# Patient Record
Sex: Male | Born: 1965
Health system: Southern US, Community
[De-identification: ages and names within clinical notes are randomized; demographics above are authoritative.]

## PROBLEM LIST (undated history)

## (undated) DIAGNOSIS — E785 Hyperlipidemia, unspecified: Secondary | ICD-10-CM

## (undated) DIAGNOSIS — I10 Essential (primary) hypertension: Secondary | ICD-10-CM

## (undated) DIAGNOSIS — N200 Calculus of kidney: Secondary | ICD-10-CM

## (undated) DIAGNOSIS — Z87442 Personal history of urinary calculi: Secondary | ICD-10-CM

## (undated) HISTORY — PX: NO PAST SURGERIES: SHX2092

## (undated) HISTORY — DX: Hyperlipidemia, unspecified: E78.5

## (undated) HISTORY — DX: Essential (primary) hypertension: I10

---

## 2015-11-27 ENCOUNTER — Other Ambulatory Visit: Payer: Self-pay | Admitting: *Deleted

## 2015-11-27 ENCOUNTER — Telehealth: Payer: Self-pay | Admitting: Physician Assistant

## 2015-11-27 NOTE — Telephone Encounter (Signed)
Patient advised to call [facility] and have them to fax [place] a request.

## 2015-12-04 MED ORDER — LOSARTAN POTASSIUM 100 MG PO TABS: 100.0000 mg | ORAL_TABLET | Freq: Every day | ORAL | 0 refills | Status: DC

## 2016-01-06 ENCOUNTER — Telehealth: Payer: Self-pay | Admitting: Physician Assistant

## 2016-01-06 ENCOUNTER — Other Ambulatory Visit: Payer: Self-pay | Admitting: Physician Assistant

## 2016-01-06 MED ORDER — LOSARTAN POTASSIUM 100 MG PO TABS: 100.0000 mg | ORAL_TABLET | Freq: Every day | ORAL | 0 refills | Status: DC

## 2016-01-06 NOTE — Telephone Encounter (Signed)
Patient aware rx sent to pharmacy.  

## 2016-01-13 ENCOUNTER — Encounter: Payer: Self-pay | Admitting: Physician Assistant

## 2016-01-13 ENCOUNTER — Ambulatory Visit (INDEPENDENT_AMBULATORY_CARE_PROVIDER_SITE_OTHER): Payer: BLUE CROSS/BLUE SHIELD | Admitting: Physician Assistant

## 2016-01-13 VITALS — BP 122/85 | HR 56 | Temp 97.2°F | Ht 68.0 in | Wt 213.8 lb

## 2016-01-13 DIAGNOSIS — Z0000 Encounter for general adult medical examination without abnormal findings: Secondary | ICD-10-CM | POA: Diagnosis not present

## 2016-01-13 MED ORDER — LOSARTAN POTASSIUM 100 MG PO TABS: 100.0000 mg | ORAL_TABLET | Freq: Every day | ORAL | 11 refills | Status: DC

## 2016-01-13 MED ORDER — AMLODIPINE BESYLATE 10 MG PO TABS: 10.0000 mg | ORAL_TABLET | Freq: Every day | ORAL | 11 refills | Status: DC

## 2016-01-13 NOTE — Progress Notes (Signed)
BP 122/85   Pulse (!) 56   Temp 97.2 F (36.2 C) (Oral)   Ht _0  (1.727 m)   Wt 213 lb 12.8 oz (97 kg)   BMI 32.51 kg/m    Subjective:    Patient ID: Matthew Weber, male    DOB: 28-Feb-1965, 50 y.o.   MRN: [id number]  Matthew Weber is a 50 y.o. male presenting on 01/13/2016 for Annual Exam  HPI Patient here to be established as new patient at [facility].  This patient is known to me from [facility]. This patient comes in for annual well physical examination. All medications are reviewed today. There are no reports of any problems with the medications. All of the medical conditions are reviewed and updated.  Lab work is reviewed and will be ordered as medically necessary. There are no new problems reported with today's visit.  Patient reports doing well overall.  Past Medical History:  Diagnosis Date  . Hypertension    Relevant past medical, surgical, family and social history reviewed and updated as indicated. Interim medical history since our last visit reviewed. Allergies and medications reviewed and updated.   Data reviewed from any sources in EPIC.  Review of Systems  Constitutional: Negative.  Negative for appetite change and fatigue.  HENT: Negative.   Eyes: Negative.  Negative for pain and visual disturbance.  Respiratory: Negative.  Negative for cough, chest tightness, shortness of breath and wheezing.   Cardiovascular: Negative.  Negative for chest pain, palpitations and leg swelling.  Gastrointestinal: Negative.  Negative for abdominal pain, diarrhea, nausea and vomiting.  Endocrine: Negative.   Genitourinary: Negative.   Musculoskeletal: Positive for arthralgias and joint swelling.  Skin: Negative.  Negative for color change and rash.  Neurological: Negative.  Negative for weakness, numbness and headaches.  Psychiatric/Behavioral: Negative.      Social History   Social History  . Marital status: Married    Spouse name: N/A    . Number of children: N/A  . Years of education: N/A   Occupational History  . Not on file.   Social History Main Topics  . Smoking status: Never Smoker  . Smokeless tobacco: Never Used  . Alcohol use No  . Drug use: No  . Sexual activity: Not on file   Other Topics Concern  . Not on file   Social History Narrative  . No narrative on file    History reviewed. No pertinent surgical history.  Family History  Problem Relation Age of Onset  . Diabetes Mother   . Heart attack Father     Allergies as of 01/13/2016   No Known Allergies     Medication List       Accurate as of 01/13/16 11:17 AM. Always use your most recent med list.          amLODipine 10 MG tablet Commonly known as:  NORVASC Take 1 tablet (10 mg total) by mouth daily.   aspirin 325 MG tablet Take 325 mg by mouth daily.   Fish Oil 1200 MG Caps Take 1,200 mg by mouth daily.   Garlic 6553 MG Caps Take 1,000 mg by mouth 2 (two) times daily.   losartan 100 MG tablet Commonly known as:  COZAAR Take 1 tablet (100 mg total) by mouth daily.   Magnesium 250 MG Tabs Take 250 mg by mouth 2 (two) times daily.          Objective:    BP 122/85  Pulse (!) 56   Temp 97.2 F (36.2 C) (Oral)   Ht _0  (1.727 m)   Wt 213 lb 12.8 oz (97 kg)   BMI 32.51 kg/m   No Known Allergies Wt Readings from Last 3 Encounters:  01/13/16 213 lb 12.8 oz (97 kg)    Physical Exam  Constitutional: He appears well-developed and well-nourished.  HENT:  Head: Normocephalic and atraumatic.  Eyes: Conjunctivae and EOM are normal. Pupils are equal, round, and reactive to light.  Neck: Normal range of motion. Neck supple.  Cardiovascular: Normal rate, regular rhythm and normal heart sounds.   Pulmonary/Chest: Effort normal and breath sounds normal.  Abdominal: Soft. Bowel sounds are normal.  Musculoskeletal:       Right knee: He exhibits decreased range of motion and swelling. Tenderness found.       Left knee:  He exhibits decreased range of motion and swelling.  Skin: Skin is warm and dry.    No results found for this or any previous visit.    Assessment & Plan:   1. Well adult exam - CBC with Differential/Platelet - CMP14+EGFR - Lipid panel - PSA   Continue all other maintenance medications as listed above. Educational handout given for health maintenance, DASH diet   Follow up plan: Return in about 6 months (around 07/13/2016) for recheck BP.  Terald Sleeper PA-C [facility] [address]  [place], [place] [address] [phone]   01/13/2016, 11:17 AM

## 2016-01-13 NOTE — Patient Instructions (Addendum)
DASH Eating Plan DASH stands for "Dietary Approaches to Stop Hypertension." The DASH eating plan is a healthy eating plan that has been shown to reduce high blood pressure (hypertension). Additional health benefits may include reducing the risk of type 2 diabetes mellitus, heart disease, and stroke. The DASH eating plan may also help with weight loss. What do I need to know about the DASH eating plan? For the DASH eating plan, you will follow these general guidelines:  Choose foods with less than 150 milligrams of sodium per serving (as listed on the food label).  Use salt-free seasonings or herbs instead of table salt or sea salt.  Check with your health care provider or pharmacist before using salt substitutes.  Eat lower-sodium products. These are often labeled as "low-sodium" or "no salt added."  Eat fresh foods. Avoid eating a lot of canned foods.  Eat more vegetables, fruits, and low-fat dairy products.  Choose whole grains. Look for the word "whole" as the first word in the ingredient list.  Choose fish and skinless chicken or turkey more often than red meat. Limit fish, poultry, and meat to 6 oz (170 g) each day.  Limit sweets, desserts, sugars, and sugary drinks.  Choose heart-healthy fats.  Eat more home-cooked food and less restaurant, buffet, and fast food.  Limit fried foods.  Do not fry foods. Cook foods using methods such as baking, boiling, grilling, and broiling instead.  When eating at a restaurant, ask that your food be prepared with less salt, or no salt if possible. What foods can I eat? Seek help from a dietitian for individual calorie needs. Grains  Whole grain or whole wheat bread. Brown rice. Whole grain or whole wheat pasta. Quinoa, bulgur, and whole grain cereals. Low-sodium cereals. Corn or whole wheat flour tortillas. Whole grain cornbread. Whole grain crackers. Low-sodium crackers. Vegetables  Fresh or frozen vegetables (raw, steamed, roasted, or  grilled). Low-sodium or reduced-sodium tomato and vegetable juices. Low-sodium or reduced-sodium tomato sauce and paste. Low-sodium or reduced-sodium canned vegetables. Fruits  All fresh, canned (in natural juice), or frozen fruits. Meat and Other Protein Products  Ground beef (85% or leaner), grass-fed beef, or beef trimmed of fat. Skinless chicken or turkey. Ground chicken or turkey. Pork trimmed of fat. All fish and seafood. Eggs. Dried beans, peas, or lentils. Unsalted nuts and seeds. Unsalted canned beans. Dairy  Low-fat dairy products, such as skim or 1% milk, 2% or reduced-fat cheeses, low-fat ricotta or cottage cheese, or plain low-fat yogurt. Low-sodium or reduced-sodium cheeses. Fats and Oils  Tub margarines without trans fats. Light or reduced-fat mayonnaise and salad dressings (reduced sodium). Avocado. Safflower, olive, or canola oils. Natural peanut or almond butter. Other  Unsalted popcorn and pretzels. The items listed above may not be a complete list of recommended foods or beverages. Contact your dietitian for more options.  What foods are not recommended? Grains  White bread. White pasta. White rice. Refined cornbread. Bagels and croissants. Crackers that contain trans fat. Vegetables  Creamed or fried vegetables. Vegetables in a cheese sauce. Regular canned vegetables. Regular canned tomato sauce and paste. Regular tomato and vegetable juices. Fruits  Canned fruit in light or heavy syrup. Fruit juice. Meat and Other Protein Products  Fatty cuts of meat. Ribs, chicken wings, bacon, sausage, bologna, salami, chitterlings, fatback, hot dogs, bratwurst, and packaged luncheon meats. Salted nuts and seeds. Canned beans with salt. Dairy  Whole or 2% milk, cream, half-and-half, and cream cheese. Whole-fat or sweetened yogurt. Full-fat cheeses   or blue cheese. Nondairy creamers and whipped toppings. Processed cheese, cheese spreads, or cheese curds. Condiments  Onion and garlic  salt, seasoned salt, table salt, and sea salt. Canned and packaged gravies. Worcestershire sauce. Tartar sauce. Barbecue sauce. Teriyaki sauce. Soy sauce, including reduced sodium. Steak sauce. Fish sauce. Oyster sauce. Cocktail sauce. Horseradish. Ketchup and mustard. Meat flavorings and tenderizers. Bouillon cubes. Hot sauce. Tabasco sauce. Marinades. Taco seasonings. Relishes. Fats and Oils  Butter, stick margarine, lard, shortening, ghee, and bacon fat. Coconut, palm kernel, or palm oils. Regular salad dressings. Other  Pickles and olives. Salted popcorn and pretzels. The items listed above may not be a complete list of foods and beverages to avoid. Contact your dietitian for more information.  Where can I find more information? National Heart, Lung, and Blood Institute: travelstabloid.com This information is not intended to replace advice given to you by your health care provider. Make sure you discuss any questions you have with your health care provider. Document Released: 12/30/2010 Document Revised: 06/18/2015 Document Reviewed: 11/14/2012 Elsevier Interactive Patient Education  2017 [place] Maintenance, Male A healthy lifestyle and preventative care can promote health and wellness.  Maintain regular health, dental, and eye exams.  Eat a healthy diet. Foods like vegetables, fruits, whole grains, low-fat dairy products, and lean protein foods contain the nutrients you need and are low in calories. Decrease your intake of foods high in solid fats, added sugars, and salt. Get information about a proper diet from your health care provider, if necessary.  Regular physical exercise is one of the most important things you can do for your health. Most adults should get at least 150 minutes of moderate-intensity exercise (any activity that increases your heart rate and causes you to sweat) each week. In addition, most adults need muscle-strengthening  exercises on 2 or more days a week.   Maintain a healthy weight. The body mass index (BMI) is a screening tool to identify possible weight problems. It provides an estimate of body fat based on height and weight. Your health care provider can find your BMI and can help you achieve or maintain a healthy weight. For males 20 years and older:  A BMI below 18.5 is considered underweight.  A BMI of 18.5 to 24.9 is normal.  A BMI of 25 to 29.9 is considered overweight.  A BMI of 30 and above is considered obese.  Maintain normal blood lipids and cholesterol by exercising and minimizing your intake of saturated fat. Eat a balanced diet with plenty of fruits and vegetables. Blood tests for lipids and cholesterol should begin at age 38 and be repeated every 5 years. If your lipid or cholesterol levels are high, you are over age 19, or you are at high risk for heart disease, you may need your cholesterol levels checked more frequently.Ongoing high lipid and cholesterol levels should be treated with medicines if diet and exercise are not working.  If you smoke, find out from your health care provider how to quit. If you do not use tobacco, do not start.  Lung cancer screening is recommended for adults aged 40-80 years who are at high risk for developing lung cancer because of a history of smoking. A yearly low-dose CT scan of the lungs is recommended for people who have at least a 30-pack-year history of smoking and are current smokers or have quit within the past 15 years. A pack year of smoking is smoking an average of 1 pack of cigarettes a day  for 1 year (for example, a 30-pack-year history of smoking could mean smoking 1 pack a day for 30 years or 2 packs a day for 15 years). Yearly screening should continue until the smoker has stopped smoking for at least 15 years. Yearly screening should be stopped for people who develop a health problem that would prevent them from having lung cancer treatment.  If  you choose to drink alcohol, do not have more than 2 drinks per day. One drink is considered to be 12 oz (360 mL) of beer, 5 oz (150 mL) of wine, or 1.5 oz (45 mL) of liquor.  Avoid the use of street drugs. Do not share needles with anyone. Ask for help if you need support or instructions about stopping the use of drugs.  High blood pressure causes heart disease and increases the risk of stroke. High blood pressure is more likely to develop in:  People who have blood pressure in the end of the normal range (100-139/85-89 mm Hg).  People who are overweight or obese.  People who are African American.  If you are 46-23 years of age, have your blood pressure checked every 3-5 years. If you are 58 years of age or older, have your blood pressure checked every year. You should have your blood pressure measured twice-once when you are at a hospital or clinic, and once when you are not at a hospital or clinic. Record the average of the two measurements. To check your blood pressure when you are not at a hospital or clinic, you can use:  An automated blood pressure machine at a pharmacy.  A home blood pressure monitor.  If you are 68-57 years old, ask your health care provider if you should take aspirin to prevent heart disease.  Diabetes screening involves taking a blood sample to check your fasting blood sugar level. This should be done once every 3 years after age 64 if you are at a normal weight and without risk factors for diabetes. Testing should be considered at a younger age or be carried out more frequently if you are overweight and have at least 1 risk factor for diabetes.  Colorectal cancer can be detected and often prevented. Most routine colorectal cancer screening begins at the age of 74 and continues through age 19. However, your health care provider may recommend screening at an earlier age if you have risk factors for colon cancer. On a yearly basis, your health care provider may provide  home test kits to check for hidden blood in the stool. A small camera at the end of a tube may be used to directly examine the colon (sigmoidoscopy or colonoscopy) to detect the earliest forms of colorectal cancer. Talk to your health care provider about this at age 66 when routine screening begins. A direct exam of the colon should be repeated every 5-10 years through age 53, unless early forms of precancerous polyps or small growths are found.  People who are at an increased risk for hepatitis B should be screened for this virus. You are considered at high risk for hepatitis B if:  You were born in a country where hepatitis B occurs often. Talk with your health care provider about which countries are considered high risk.  Your parents were born in a high-risk country and you have not received a shot to protect against hepatitis B (hepatitis B vaccine).  You have HIV or AIDS.  You use needles to inject street drugs.  You live with,  or have sex with, someone who has hepatitis B.  You are a man who has sex with other men (MSM).  You get hemodialysis treatment.  You take certain medicines for conditions like cancer, organ transplantation, and autoimmune conditions.  Hepatitis C blood testing is recommended for all people born from [birth year] through 1966 and any individual with known risk factors for hepatitis C.  Healthy men should no longer receive prostate-specific antigen (PSA) blood tests as part of routine cancer screening. Talk to your health care provider about prostate cancer screening.  Testicular cancer screening is not recommended for adolescents or adult males who have no symptoms. Screening includes self-exam, a health care provider exam, and other screening tests. Consult with your health care provider about any symptoms you have or any concerns you have about testicular cancer.  Practice safe sex. Use condoms and avoid high-risk sexual practices to reduce the spread of sexually  transmitted infections (STIs).  You should be screened for STIs, including gonorrhea and chlamydia if:  You are sexually active and are younger than 24 years.  You are older than 24 years, and your health care provider tells you that you are at risk for this type of infection.  Your sexual activity has changed since you were last screened, and you are at an increased risk for chlamydia or gonorrhea. Ask your health care provider if you are at risk.  If you are at risk of being infected with HIV, it is recommended that you take a prescription medicine daily to prevent HIV infection. This is called pre-exposure prophylaxis (PrEP). You are considered at risk if:  You are a man who has sex with other men (MSM).  You are a heterosexual man who is sexually active with multiple partners.  You take drugs by injection.  You are sexually active with a partner who has HIV.  Talk with your health care provider about whether you are at high risk of being infected with HIV. If you choose to begin PrEP, you should first be tested for HIV. You should then be tested every 3 months for as long as you are taking PrEP.  Use sunscreen. Apply sunscreen liberally and repeatedly throughout the day. You should seek shade when your shadow is shorter than you. Protect yourself by wearing long sleeves, pants, a wide-brimmed hat, and sunglasses year round whenever you are outdoors.  Tell your health care provider of new moles or changes in moles, especially if there is a change in shape or color. Also, tell your health care provider if a mole is larger than the size of a pencil eraser.  A one-time screening for abdominal aortic aneurysm (AAA) and surgical repair of large AAAs by ultrasound is recommended for men aged 33-75 years who are current or former smokers.  Stay current with your vaccines (immunizations). This information is not intended to replace advice given to you by your health care provider. Make sure  you discuss any questions you have with your health care provider. Document Released: 07/09/2007 Document Revised: 01/31/2014 Document Reviewed: 10/14/2014 Elsevier Interactive Patient Education  2017 [employer].

## 2016-01-14 LAB — CMP14+EGFR
A/G RATIO: 1.8 (ref 1.2–2.2)
ALK PHOS: 69 IU/L (ref 39–117)
ALT: 25 IU/L (ref 0–44)
AST: 23 IU/L (ref 0–40)
Albumin: 4.5 g/dL (ref 3.5–5.5)
BUN/Creatinine Ratio: 14 (ref 9–20)
BUN: 13 mg/dL (ref 6–24)
Bilirubin Total: 1.7 mg/dL — ABNORMAL HIGH (ref 0.0–1.2)
CO2: 24 mmol/L (ref 18–29)
Calcium: 9.6 mg/dL (ref 8.7–10.2)
Chloride: 102 mmol/L (ref 96–106)
Creatinine, Ser: 0.92 mg/dL (ref 0.76–1.27)
GFR calc Af Amer: 111 mL/min/{1.73_m2} (ref >59)
GFR calc non Af Amer: 96 mL/min/{1.73_m2} (ref >59)
GLOBULIN, TOTAL: 2.5 g/dL (ref 1.5–4.5)
Glucose: 79 mg/dL (ref 65–99)
POTASSIUM: 4 mmol/L (ref 3.5–5.2)
SODIUM: 141 mmol/L (ref 134–144)
Total Protein: 7 g/dL (ref 6.0–8.5)

## 2016-01-14 LAB — CBC WITH DIFFERENTIAL/PLATELET
BASOS ABS: 0 10*3/uL (ref 0.0–0.2)
BASOS: 1 %
EOS (ABSOLUTE): 0.1 10*3/uL (ref 0.0–0.4)
Eos: 3 %
Hematocrit: 39.4 % (ref 37.5–51.0)
Hemoglobin: 13.6 g/dL (ref 13.0–17.7)
IMMATURE GRANS (ABS): 0 10*3/uL (ref 0.0–0.1)
IMMATURE GRANULOCYTES: 0 %
LYMPHS: 23 %
Lymphocytes Absolute: 1 10*3/uL (ref 0.7–3.1)
MCH: 30.1 pg (ref 26.6–33.0)
MCHC: 34.5 g/dL (ref 31.5–35.7)
MCV: 87 fL (ref 79–97)
MONOS ABS: 0.4 10*3/uL (ref 0.1–0.9)
Monocytes: 10 %
NEUTROS PCT: 63 %
Neutrophils Absolute: 2.6 10*3/uL (ref 1.4–7.0)
PLATELETS: 280 10*3/uL (ref 150–379)
RBC: 4.52 x10E6/uL (ref 4.14–5.80)
RDW: 13.8 % (ref 12.3–15.4)
WBC: 4.2 10*3/uL (ref 3.4–10.8)

## 2016-01-14 LAB — LIPID PANEL
CHOLESTEROL TOTAL: 208 mg/dL — AB (ref 100–199)
Chol/HDL Ratio: 4.4 ratio units (ref 0.0–5.0)
HDL: 47 mg/dL (ref >39)
LDL Calculated: 133 mg/dL — ABNORMAL HIGH (ref 0–99)
Triglycerides: 138 mg/dL (ref 0–149)
VLDL Cholesterol Cal: 28 mg/dL (ref 5–40)

## 2016-01-14 LAB — PSA: Prostate Specific Ag, Serum: 0.7 ng/mL (ref 0.0–4.0)

## 2016-05-18 ENCOUNTER — Encounter: Payer: Self-pay | Admitting: Family Medicine

## 2016-05-18 ENCOUNTER — Ambulatory Visit (INDEPENDENT_AMBULATORY_CARE_PROVIDER_SITE_OTHER): Payer: BLUE CROSS/BLUE SHIELD | Admitting: Family Medicine

## 2016-05-18 VITALS — BP 114/80 | HR 69 | Temp 97.1°F | Ht 68.0 in | Wt 213.0 lb

## 2016-05-18 DIAGNOSIS — J069 Acute upper respiratory infection, unspecified: Secondary | ICD-10-CM

## 2016-05-18 DIAGNOSIS — M7989 Other specified soft tissue disorders: Secondary | ICD-10-CM | POA: Diagnosis not present

## 2016-05-18 MED ORDER — FLUTICASONE PROPIONATE 50 MCG/ACT NA SUSP: 1.0000 | Freq: Two times a day (BID) | NASAL | 6 refills | Status: DC | PRN

## 2016-05-18 NOTE — Progress Notes (Signed)
BP 114/80   Pulse 69   Temp 97.1 F (36.2 C) (Oral)   Ht 5\' 8"  (1.727 m)   Wt 213 lb (96.6 kg)   BMI 32.39 kg/m    Subjective:    Patient ID: Matthew Weber, male    DOB: 1965-03-05, 51 y.o.   MRN: [id number]  HPI: Ludger Bones is a 51 y.o. male presenting on 05/18/2016 for Knot on throat (first noticed 3 weeks ago, has not changed) and Sinusitis (sinus pressure, cough)   HPI Sinus congestion and cough Patient has been having sinus congestion and cough that has been going on for the past 1 week. The cough is nonproductive and the sinus pressure associated with drainage and postnasal drainage. He says that there have been others in his family that have been ill with similar illness and his wife was ill last week with similar thing. He denies any fevers or chills or shortness of breath or wheezing. He has not used anything over-the-counter to this point to help with that just yet. He does not feel like it's improving or worsening but just sticking around.  A knot on his throat Patient has a knot on the front of his throat that he feels like his come up over the past 3 weeks. It is soft and moves easily and it's not painful. The knot is right in the midline of his throat just below his Adam's apple. He has never had this previously and denies any trauma. He does have a current upper respiratory infection that is likely viral in nature. He denies any difficulties with swallowing or breathing because of this knot.  Relevant past medical, surgical, family and social history reviewed and updated as indicated. Interim medical history since our last visit reviewed. Allergies and medications reviewed and updated.  Review of Systems  Constitutional: Negative for chills and fever.  HENT: Positive for congestion, postnasal drip, rhinorrhea, sinus pressure, sneezing and sore throat. Negative for ear discharge, ear pain and voice change.   Eyes: Negative for pain, discharge, redness and visual  disturbance.  Respiratory: Negative for shortness of breath and wheezing.   Cardiovascular: Negative for chest pain and leg swelling.  Musculoskeletal: Negative for back pain and gait problem.  Skin: Negative for color change, rash and wound.  Neurological: Negative for dizziness, weakness, light-headedness and numbness.  All other systems reviewed and are negative.  Per HPI unless specifically indicated above     Objective:    BP 114/80   Pulse 69   Temp 97.1 F (36.2 C) (Oral)   Ht 5\' 8"  (1.727 m)   Wt 213 lb (96.6 kg)   BMI 32.39 kg/m   Wt Readings from Last 3 Encounters:  05/18/16 213 lb (96.6 kg)  01/13/16 213 lb 12.8 oz (97 kg)    Physical Exam  Constitutional: He is oriented to person, place, and time. He appears well-developed and well-nourished. No distress.  HENT:  Right Ear: Tympanic membrane, external ear and ear canal normal.  Left Ear: Tympanic membrane, external ear and ear canal normal.  Nose: Mucosal edema and rhinorrhea present. No sinus tenderness. No epistaxis. Right sinus exhibits maxillary sinus tenderness. Right sinus exhibits no frontal sinus tenderness. Left sinus exhibits maxillary sinus tenderness. Left sinus exhibits no frontal sinus tenderness.  Mouth/Throat: Uvula is midline and mucous membranes are normal. Posterior oropharyngeal edema and posterior oropharyngeal erythema present. No oropharyngeal exudate or tonsillar abscesses.  Eyes: Conjunctivae are normal. No scleral icterus.  Neck: Neck supple. No  thyromegaly present.    Cardiovascular: Normal rate, regular rhythm, normal heart sounds and intact distal pulses.   No murmur heard. Pulmonary/Chest: Effort normal and breath sounds normal. No respiratory distress. He has no wheezes. He has no rales.  Musculoskeletal: Normal range of motion. He exhibits no edema.  Lymphadenopathy:    He has no cervical adenopathy.  Neurological: He is alert and oriented to person, place, and time. Coordination  normal.  Skin: Skin is warm and dry. No rash noted. He is not diaphoretic.  Psychiatric: He has a normal mood and affect. His behavior is normal.  Nursing note and vitals reviewed.       Assessment & Plan:   Problem List Items Addressed This Visit    None    Visit Diagnoses    Nodule of soft tissue    -  Primary   Nodule of the soft tissue on the anterior neck, will send for ultrasound   Relevant Orders   US Soft Tissue Head/Neck   Viral upper respiratory infection       Relevant Medications   fluticasone (FLONASE) 50 MCG/ACT nasal spray       Follow up plan: Return if symptoms worsen or fail to improve.  Counseling provided for all of the vaccine components Orders Placed This Encounter  Procedures  . US Soft Tissue Head/Neck    Joshua Dettinger, MD [facility] 05/18/2016, 2:58 PM

## 2016-05-27 ENCOUNTER — Ambulatory Visit (HOSPITAL_COMMUNITY)
Admission: RE | Admit: 2016-05-27 | Discharge: 2016-05-27 | Disposition: A | Discharge status: Home / Self Care | Payer: BLUE CROSS/BLUE SHIELD | Source: Ambulatory Visit | Attending: Family Medicine | Admitting: Family Medicine

## 2016-05-27 ENCOUNTER — Ambulatory Visit (HOSPITAL_COMMUNITY): Payer: Self-pay

## 2016-05-27 ENCOUNTER — Ambulatory Visit (HOSPITAL_COMMUNITY): Payer: BLUE CROSS/BLUE SHIELD

## 2016-05-27 DIAGNOSIS — M7989 Other specified soft tissue disorders: Secondary | ICD-10-CM | POA: Diagnosis present

## 2016-05-30 ENCOUNTER — Other Ambulatory Visit: Payer: Self-pay | Admitting: *Deleted

## 2016-05-30 DIAGNOSIS — M7989 Other specified soft tissue disorders: Secondary | ICD-10-CM

## 2016-06-21 ENCOUNTER — Telehealth: Payer: Self-pay | Admitting: Family Medicine

## 2016-06-21 NOTE — Telephone Encounter (Signed)
Call given to nurse °

## 2016-07-13 ENCOUNTER — Ambulatory Visit: Payer: BLUE CROSS/BLUE SHIELD | Admitting: Physician Assistant

## 2016-08-09 DIAGNOSIS — D170 Benign lipomatous neoplasm of skin and subcutaneous tissue of head, face and neck: Secondary | ICD-10-CM | POA: Insufficient documentation

## 2016-08-17 ENCOUNTER — Encounter: Payer: Self-pay | Admitting: Physician Assistant

## 2016-08-17 ENCOUNTER — Ambulatory Visit (INDEPENDENT_AMBULATORY_CARE_PROVIDER_SITE_OTHER): Payer: BLUE CROSS/BLUE SHIELD | Admitting: Physician Assistant

## 2016-08-17 VITALS — BP 127/85 | HR 69 | Temp 96.9°F | Ht 68.0 in | Wt 205.2 lb

## 2016-08-17 DIAGNOSIS — E7889 Other lipoprotein metabolism disorders: Secondary | ICD-10-CM

## 2016-08-17 DIAGNOSIS — J301 Allergic rhinitis due to pollen: Secondary | ICD-10-CM | POA: Diagnosis not present

## 2016-08-17 DIAGNOSIS — I10 Essential (primary) hypertension: Secondary | ICD-10-CM | POA: Diagnosis not present

## 2016-08-17 LAB — LIPID PANEL
CHOLESTEROL TOTAL: 201 mg/dL — AB (ref 100–199)
Chol/HDL Ratio: 4.7 ratio (ref 0.0–5.0)
HDL: 43 mg/dL (ref >39)
LDL CALC: 132 mg/dL — AB (ref 0–99)
TRIGLYCERIDES: 131 mg/dL (ref 0–149)
VLDL CHOLESTEROL CAL: 26 mg/dL (ref 5–40)

## 2016-08-17 MED ORDER — LORATADINE 10 MG PO TABS: 10.0000 mg | ORAL_TABLET | Freq: Every day | ORAL | 3 refills | Status: DC

## 2016-08-17 NOTE — Progress Notes (Signed)
BP 127/85   Pulse 69   Temp (!) 96.9 F (36.1 C) (Oral)   Ht 5' 8"  (1.727 m)   Wt 205 lb 3.2 oz (93.1 kg)   BMI 31.20 kg/m    Subjective:    Patient ID: Matthew Weber, male    DOB: [birth year], 51 y.o.   MRN: [id number]  HPI: Matthew Weber is a 51 y.o. male presenting on 08/17/2016 for Follow-up (6 month ) and Nausea  This patient comes in for periodic recheck on medications and conditions including hypertension, elevated cholesterol, allergic rhinitis. He has been doing very well with his diet and exercise and is down about 8 more pounds. Blood pressures been quite good when he checks it. We will draw the lipid today to see that it is improved. He is continuing with some nasal congestion and fullness and postnasal drainage. He was unable to take the [facility] because he had a nosebleed after using it.   All medications are reviewed today. There are no reports of any problems with the medications. All of the medical conditions are reviewed and updated.  Lab work is reviewed and will be ordered as medically necessary. There are no new problems reported with today's visit.   Relevant past medical, surgical, family and social history reviewed and updated as indicated. Allergies and medications reviewed and updated.  Past Medical History:  Diagnosis Date  . Hypertension     History reviewed. No pertinent surgical history.  Review of Systems  Constitutional: Negative.  Negative for appetite change and fatigue.  HENT: Positive for congestion, postnasal drip and rhinorrhea.   Eyes: Negative.  Negative for pain and visual disturbance.  Respiratory: Negative.  Negative for cough, chest tightness, shortness of breath and wheezing.   Cardiovascular: Negative.  Negative for chest pain, palpitations and leg swelling.  Gastrointestinal: Positive for nausea. Negative for abdominal pain, diarrhea and vomiting.  Endocrine: Negative.   Genitourinary: Negative.   Musculoskeletal: Negative.   Skin:  Negative.  Negative for color change and rash.  Neurological: Negative.  Negative for weakness, numbness and headaches.  Psychiatric/Behavioral: Negative.     Allergies as of 08/17/2016   No Known Allergies     Medication List       Accurate as of 08/17/16  9:42 AM. Always use your most recent med list.          amLODipine 10 MG tablet Commonly known as:  NORVASC Take 1 tablet (10 mg total) by mouth daily.   aspirin 325 MG tablet Take 325 mg by mouth daily.   Fish Oil 1200 MG Caps Take 1,200 mg by mouth daily.   Garlic 8882 MG Caps Take 1,000 mg by mouth 2 (two) times daily.   loratadine 10 MG tablet Commonly known as:  CLARITIN Take 1 tablet (10 mg total) by mouth daily.   losartan 100 MG tablet Commonly known as:  COZAAR Take 1 tablet (100 mg total) by mouth daily.   Magnesium 250 MG Tabs Take 250 mg by mouth 2 (two) times daily.          Objective:    BP 127/85   Pulse 69   Temp (!) 96.9 F (36.1 C) (Oral)   Ht 5' 8"  (1.727 m)   Wt 205 lb 3.2 oz (93.1 kg)   BMI 31.20 kg/m   No Known Allergies  Physical Exam  Constitutional: He appears well-developed and well-nourished. No distress.  HENT:  Head: Normocephalic and atraumatic.  Eyes: Pupils are equal, round,  and reactive to light. Conjunctivae and EOM are normal.  Cardiovascular: Normal rate, regular rhythm and normal heart sounds.   Pulmonary/Chest: Effort normal and breath sounds normal. No respiratory distress.  Skin: Skin is warm and dry.  Psychiatric: He has a normal mood and affect. His behavior is normal.  Nursing note and vitals reviewed.   Results for orders placed or performed in visit on 01/13/16  CBC with Differential/Platelet  Result Value Ref Range   WBC 4.2 3.4 - 10.8 x10E3/uL   RBC 4.52 4.14 - 5.80 x10E6/uL   Hemoglobin 13.6 13.0 - 17.7 g/dL   Hematocrit 39.4 37.5 - 51.0 %   MCV 87 79 - 97 fL   MCH 30.1 26.6 - 33.0 pg   MCHC 34.5 31.5 - 35.7 g/dL   RDW 13.8 12.3 - 15.4 %    Platelets 280 150 - 379 x10E3/uL   Neutrophils 63 Not Estab. %   Lymphs 23 Not Estab. %   Monocytes 10 Not Estab. %   Eos 3 Not Estab. %   Basos 1 Not Estab. %   Neutrophils Absolute 2.6 1.4 - 7.0 x10E3/uL   Lymphocytes Absolute 1.0 0.7 - 3.1 x10E3/uL   Monocytes Absolute 0.4 0.1 - 0.9 x10E3/uL   EOS (ABSOLUTE) 0.1 0.0 - 0.4 x10E3/uL   Basophils Absolute 0.0 0.0 - 0.2 x10E3/uL   Immature Granulocytes 0 Not Estab. %   Immature Grans (Abs) 0.0 0.0 - 0.1 x10E3/uL  CMP14+EGFR  Result Value Ref Range   Glucose 79 65 - 99 mg/dL   BUN 13 6 - 24 mg/dL   Creatinine, Ser 0.92 0.76 - 1.27 mg/dL   GFR calc non Af Amer 96 >59 mL/min/1.73   GFR calc Af Amer 111 >59 mL/min/1.73   BUN/Creatinine Ratio 14 9 - 20   Sodium 141 134 - 144 mmol/L   Potassium 4.0 3.5 - 5.2 mmol/L   Chloride 102 96 - 106 mmol/L   CO2 24 18 - 29 mmol/L   Calcium 9.6 8.7 - 10.2 mg/dL   Total Protein 7.0 6.0 - 8.5 g/dL   Albumin 4.5 3.5 - 5.5 g/dL   Globulin, Total 2.5 1.5 - 4.5 g/dL   Albumin/Globulin Ratio 1.8 1.2 - 2.2   Bilirubin Total 1.7 (H) 0.0 - 1.2 mg/dL   Alkaline Phosphatase 69 39 - 117 IU/L   AST 23 0 - 40 IU/L   ALT 25 0 - 44 IU/L  Lipid panel  Result Value Ref Range   Cholesterol, Total 208 (H) 100 - 199 mg/dL   Triglycerides 138 0 - 149 mg/dL   HDL 47 >39 mg/dL   VLDL Cholesterol Cal 28 5 - 40 mg/dL   LDL Calculated 133 (H) 0 - 99 mg/dL   Chol/HDL Ratio 4.4 0.0 - 5.0 ratio units  PSA  Result Value Ref Range   Prostate Specific Ag, Serum 0.7 0.0 - 4.0 ng/mL      Assessment & Plan:   1. Lipids abnormal - Lipid panel  2. Essential hypertension  3. Allergic rhinitis due to pollen, unspecified seasonality - loratadine (CLARITIN) 10 MG tablet; Take 1 tablet (10 mg total) by mouth daily.  Dispense: 90 tablet; Refill: 3   Current Outpatient Prescriptions:  .  amLODipine (NORVASC) 10 MG tablet, Take 1 tablet (10 mg total) by mouth daily., Disp: 30 tablet, Rfl: 11 .  aspirin 325 MG tablet, Take  325 mg by mouth daily., Disp: , Rfl:  .  Garlic 7416 MG CAPS, Take 1,000 mg  by mouth 2 (two) times daily., Disp: , Rfl:  .  losartan (COZAAR) 100 MG tablet, Take 1 tablet (100 mg total) by mouth daily., Disp: 30 tablet, Rfl: 11 .  Magnesium 250 MG TABS, Take 250 mg by mouth 2 (two) times daily., Disp: , Rfl:  .  Omega-3 Fatty Acids (FISH OIL) 1200 MG CAPS, Take 1,200 mg by mouth daily., Disp: , Rfl:  .  loratadine (CLARITIN) 10 MG tablet, Take 1 tablet (10 mg total) by mouth daily., Disp: 90 tablet, Rfl: 3  Continue all other maintenance medications as listed above.  Follow up plan: Return in about 6 months (around 02/17/2017) for recheck.  Educational handout given for [place] PA-C [facility] [address]  [place], [place] [address] [phone]   08/17/2016, 9:42 AM

## 2016-08-17 NOTE — Patient Instructions (Signed)
In a few days you may receive a survey in the mail or online from [employer] regarding your visit with us today. Please take a moment to fill this out. Your feedback is very important to our whole office. It can help us better understand your needs as well as improve your experience and satisfaction. Thank you for taking your time to complete it. We care about you.  Fleming Prill, PA-C  

## 2016-08-22 ENCOUNTER — Telehealth: Payer: Self-pay | Admitting: Physician Assistant

## 2016-08-22 NOTE — Telephone Encounter (Signed)
Patient aware of results and verbalizes understanding.  

## 2017-01-18 ENCOUNTER — Other Ambulatory Visit: Payer: Self-pay | Admitting: Physician Assistant

## 2017-01-18 NOTE — Telephone Encounter (Signed)
Last seen 08/17/16  [facility]

## 2017-02-06 ENCOUNTER — Other Ambulatory Visit: Payer: Self-pay | Admitting: Physician Assistant

## 2017-02-06 NOTE — Telephone Encounter (Signed)
OV 02/09/17

## 2017-02-20 ENCOUNTER — Encounter: Payer: Self-pay | Admitting: Physician Assistant

## 2017-02-20 ENCOUNTER — Ambulatory Visit (INDEPENDENT_AMBULATORY_CARE_PROVIDER_SITE_OTHER): Payer: BLUE CROSS/BLUE SHIELD | Admitting: Physician Assistant

## 2017-02-20 VITALS — BP 133/86 | HR 54 | Temp 96.7°F | Ht 68.0 in | Wt 215.6 lb

## 2017-02-20 DIAGNOSIS — Z0000 Encounter for general adult medical examination without abnormal findings: Secondary | ICD-10-CM

## 2017-02-20 NOTE — Progress Notes (Signed)
BP 133/86   Pulse (!) 54   Temp (!) 96.7 F (35.9 C) (Oral)   Ht _0  (1.727 m)   Wt 215 lb 9.6 oz (97.8 kg)   BMI 32.78 kg/m    Subjective:    Patient ID: Matthew Weber, male    DOB: 01/30/1965, 52 y.o.   MRN: [id number]  HPI: Matthew Weber is a 52 y.o. male presenting on 02/20/2017 for Annual Exam and Follow-up (6 month )  This patient comes in for annual well physical examination. All medications are reviewed today. There are no reports of any problems with the medications. All of the medical conditions are reviewed and updated.  Lab work is reviewed and will be ordered as medically necessary. There are no new problems reported with today's visit.  Patient reports doing well overall.   Relevant past medical, surgical, family and social history reviewed and updated as indicated. Allergies and medications reviewed and updated.  Past Medical History:  Diagnosis Date  . Hypertension     History reviewed. No pertinent surgical history.  Review of Systems  Allergies as of 02/20/2017   No Known Allergies     Medication List        Accurate as of 02/20/17  8:29 AM. Always use your most recent med list.          amLODipine 10 MG tablet Commonly known as:  NORVASC TAKE ONE TABLET BY MOUTH ONCE DAILY   aspirin 325 MG tablet Take 325 mg by mouth daily.   CORICIDIN HBP COUGH/COLD PO Take by mouth.   Fish Oil 1200 MG Caps Take 1,200 mg by mouth daily.   Garlic 5400 MG Caps Take 1,000 mg by mouth 2 (two) times daily.   loratadine 10 MG tablet Commonly known as:  CLARITIN Take 1 tablet (10 mg total) by mouth daily.   losartan 100 MG tablet Commonly known as:  COZAAR TAKE ONE TABLET BY MOUTH ONCE DAILY   Magnesium 250 MG Tabs Take 250 mg by mouth 2 (two) times daily.          Objective:    BP 133/86   Pulse (!) 54   Temp (!) 96.7 F (35.9 C) (Oral)   Ht _1  (1.727 m)   Wt 215 lb 9.6 oz (97.8 kg)   BMI 32.78 kg/m   No Known Allergies  Physical  Exam  Results for orders placed or performed in visit on 08/17/16  Lipid panel  Result Value Ref Range   Cholesterol, Total 201 (H) 100 - 199 mg/dL   Triglycerides 131 0 - 149 mg/dL   HDL 43 >39 mg/dL   VLDL Cholesterol Cal 26 5 - 40 mg/dL   LDL Calculated 132 (H) 0 - 99 mg/dL   Chol/HDL Ratio 4.7 0.0 - 5.0 ratio      Assessment & Plan:   1. Well adult exam - CBC with Differential/Platelet - CMP14+EGFR - Lipid panel - PSA    Current Outpatient Medications:  .  amLODipine (NORVASC) 10 MG tablet, TAKE ONE TABLET BY MOUTH ONCE DAILY, Disp: 90 tablet, Rfl: 0 .  aspirin 325 MG tablet, Take 325 mg by mouth daily., Disp: , Rfl:  .  Chlorpheniramine-DM (CORICIDIN HBP COUGH/COLD PO), Take by mouth., Disp: , Rfl:  .  Garlic 8676 MG CAPS, Take 1,000 mg by mouth 2 (two) times daily., Disp: , Rfl:  .  loratadine (CLARITIN) 10 MG tablet, Take 1 tablet (10 mg total) by mouth daily., Disp: 90  tablet, Rfl: 3 .  losartan (COZAAR) 100 MG tablet, TAKE ONE TABLET BY MOUTH ONCE DAILY, Disp: 30 tablet, Rfl: 0 .  Magnesium 250 MG TABS, Take 250 mg by mouth 2 (two) times daily., Disp: , Rfl:  .  Omega-3 Fatty Acids (FISH OIL) 1200 MG CAPS, Take 1,200 mg by mouth daily., Disp: , Rfl:  Continue all other maintenance medications as listed above.  Follow up plan: 6 months and 1 year  Educational handout given for well adult  Terald Sleeper PA-C [facility] [address]  [place], [place] [address] [phone]   02/20/2017, 8:29 AM

## 2017-02-20 NOTE — Patient Instructions (Signed)
Chlor-trimeton old antihistamine **Sudafed on the shelf, decongestant     Health Maintenance, Male A healthy lifestyle and preventive care is important for your health and wellness. Ask your health care provider about what schedule of regular examinations is right for you. What should I know about weight and diet? Eat a Healthy Diet  Eat plenty of vegetables, fruits, whole grains, low-fat dairy products, and lean protein.  Do not eat a lot of foods high in solid fats, added sugars, or salt.  Maintain a Healthy Weight Regular exercise can help you achieve or maintain a healthy weight. You should:  Do at least 150 minutes of exercise each week. The exercise should increase your heart rate and make you sweat (moderate-intensity exercise).  Do strength-training exercises at least twice a week.  Watch Your Levels of Cholesterol and Blood Lipids  Have your blood tested for lipids and cholesterol every 5 years starting at 53 years of age. If you are at high risk for heart disease, you should start having your blood tested when you are 53 years old. You may need to have your cholesterol levels checked more often if: ? Your lipid or cholesterol levels are high. ? You are older than 53 years of age. ? You are at high risk for heart disease.  What should I know about cancer screening? Many types of cancers can be detected early and may often be prevented. Lung Cancer  You should be screened every year for lung cancer if: ? You are a current smoker who has smoked for at least 30 years. ? You are a former smoker who has quit within the past 15 years.  Talk to your health care provider about your screening options, when you should start screening, and how often you should be screened.  Colorectal Cancer  Routine colorectal cancer screening usually begins at 53 years of age and should be repeated every 5-10 years until you are 53 years old. You may need to be screened more often if early  forms of precancerous polyps or small growths are found. Your health care provider may recommend screening at an earlier age if you have risk factors for colon cancer.  Your health care provider may recommend using home test kits to check for hidden blood in the stool.  A small camera at the end of a tube can be used to examine your colon (sigmoidoscopy or colonoscopy). This checks for the earliest forms of colorectal cancer.  Prostate and Testicular Cancer  Depending on your age and overall health, your health care provider may do certain tests to screen for prostate and testicular cancer.  Talk to your health care provider about any symptoms or concerns you have about testicular or prostate cancer.  Skin Cancer  Check your skin from head to toe regularly.  Tell your health care provider about any new moles or changes in moles, especially if: ? There is a change in a mole's size, shape, or color. ? You have a mole that is larger than a pencil eraser.  Always use sunscreen. Apply sunscreen liberally and repeat throughout the day.  Protect yourself by wearing long sleeves, pants, a wide-brimmed hat, and sunglasses when outside.  What should I know about heart disease, diabetes, and high blood pressure?  If you are 62-22 years of age, have your blood pressure checked every 3-5 years. If you are 54 years of age or older, have your blood pressure checked every year. You should have your blood pressure measured  twice-once when you are at a hospital or clinic, and once when you are not at a hospital or clinic. Record the average of the two measurements. To check your blood pressure when you are not at a hospital or clinic, you can use: ? An automated blood pressure machine at a pharmacy. ? A home blood pressure monitor.  Talk to your health care provider about your target blood pressure.  If you are between 10-46 years old, ask your health care provider if you should take aspirin to prevent  heart disease.  Have regular diabetes screenings by checking your fasting blood sugar level. ? If you are at a normal weight and have a low risk for diabetes, have this test once every three years after the age of 50. ? If you are overweight and have a high risk for diabetes, consider being tested at a younger age or more often.  A one-time screening for abdominal aortic aneurysm (AAA) by ultrasound is recommended for men aged 36-75 years who are current or former smokers. What should I know about preventing infection? Hepatitis B If you have a higher risk for hepatitis B, you should be screened for this virus. Talk with your health care provider to find out if you are at risk for hepatitis B infection. Hepatitis C Blood testing is recommended for:  Everyone born from [birth year] through 1966.  Anyone with known risk factors for hepatitis C.  Sexually Transmitted Diseases (STDs)  You should be screened each year for STDs including gonorrhea and chlamydia if: ? You are sexually active and are younger than 53 years of age. ? You are older than 53 years of age and your health care provider tells you that you are at risk for this type of infection. ? Your sexual activity has changed since you were last screened and you are at an increased risk for chlamydia or gonorrhea. Ask your health care provider if you are at risk.  Talk with your health care provider about whether you are at high risk of being infected with HIV. Your health care provider may recommend a prescription medicine to help prevent HIV infection.  What else can I do?  Schedule regular health, dental, and eye exams.  Stay current with your vaccines (immunizations).  Do not use any tobacco products, such as cigarettes, chewing tobacco, and e-cigarettes. If you need help quitting, ask your health care provider.  Limit alcohol intake to no more than 2 drinks per day. One drink equals 12 ounces of beer, 5 ounces of wine, or 1 ounces  of hard liquor.  Do not use street drugs.  Do not share needles.  Ask your health care provider for help if you need support or information about quitting drugs.  Tell your health care provider if you often feel depressed.  Tell your health care provider if you have ever been abused or do not feel safe at home. This information is not intended to replace advice given to you by your health care provider. Make sure you discuss any questions you have with your health care provider. Document Released: 07/09/2007 Document Revised: 09/09/2015 Document Reviewed: 10/14/2014 Elsevier Interactive Patient Education  [employer].

## 2017-02-21 LAB — LIPID PANEL
CHOL/HDL RATIO: 4.1 ratio (ref 0.0–5.0)
Cholesterol, Total: 211 mg/dL — ABNORMAL HIGH (ref 100–199)
HDL: 51 mg/dL (ref >39)
LDL Calculated: 134 mg/dL — ABNORMAL HIGH (ref 0–99)
TRIGLYCERIDES: 128 mg/dL (ref 0–149)
VLDL Cholesterol Cal: 26 mg/dL (ref 5–40)

## 2017-02-21 LAB — CMP14+EGFR
A/G RATIO: 1.6 (ref 1.2–2.2)
ALK PHOS: 64 IU/L (ref 39–117)
ALT: 25 IU/L (ref 0–44)
AST: 21 IU/L (ref 0–40)
Albumin: 4.6 g/dL (ref 3.5–5.5)
BUN / CREAT RATIO: 14 (ref 9–20)
BUN: 14 mg/dL (ref 6–24)
Bilirubin Total: 1.4 mg/dL — ABNORMAL HIGH (ref 0.0–1.2)
CALCIUM: 9.5 mg/dL (ref 8.7–10.2)
CO2: 25 mmol/L (ref 20–29)
Chloride: 103 mmol/L (ref 96–106)
Creatinine, Ser: 1.03 mg/dL (ref 0.76–1.27)
GFR calc Af Amer: 95 mL/min/{1.73_m2} (ref >59)
GFR, EST NON AFRICAN AMERICAN: 83 mL/min/{1.73_m2} (ref >59)
GLOBULIN, TOTAL: 2.8 g/dL (ref 1.5–4.5)
Glucose: 79 mg/dL (ref 65–99)
POTASSIUM: 4.6 mmol/L (ref 3.5–5.2)
SODIUM: 144 mmol/L (ref 134–144)
Total Protein: 7.4 g/dL (ref 6.0–8.5)

## 2017-02-21 LAB — CBC WITH DIFFERENTIAL/PLATELET
BASOS ABS: 0 10*3/uL (ref 0.0–0.2)
Basos: 1 %
EOS (ABSOLUTE): 0.1 10*3/uL (ref 0.0–0.4)
Eos: 4 %
HEMOGLOBIN: 15 g/dL (ref 13.0–17.7)
Hematocrit: 42.5 % (ref 37.5–51.0)
IMMATURE GRANS (ABS): 0 10*3/uL (ref 0.0–0.1)
IMMATURE GRANULOCYTES: 0 %
LYMPHS: 32 %
Lymphocytes Absolute: 1 10*3/uL (ref 0.7–3.1)
MCH: 31 pg (ref 26.6–33.0)
MCHC: 35.3 g/dL (ref 31.5–35.7)
MCV: 88 fL (ref 79–97)
MONOCYTES: 14 %
Monocytes Absolute: 0.4 10*3/uL (ref 0.1–0.9)
NEUTROS ABS: 1.5 10*3/uL (ref 1.4–7.0)
NEUTROS PCT: 49 %
PLATELETS: 254 10*3/uL (ref 150–379)
RBC: 4.84 x10E6/uL (ref 4.14–5.80)
RDW: 13.7 % (ref 12.3–15.4)
WBC: 3 10*3/uL — AB (ref 3.4–10.8)

## 2017-02-21 LAB — PSA: PROSTATE SPECIFIC AG, SERUM: 0.7 ng/mL (ref 0.0–4.0)

## 2017-03-01 ENCOUNTER — Telehealth: Payer: Self-pay | Admitting: *Deleted

## 2017-03-01 NOTE — Telephone Encounter (Signed)
Aware of lab results and knows a letter was sent with the results also.

## 2017-03-06 ENCOUNTER — Other Ambulatory Visit: Payer: Self-pay | Admitting: Physician Assistant

## 2017-07-11 ENCOUNTER — Other Ambulatory Visit: Payer: Self-pay | Admitting: Physician Assistant

## 2018-03-03 ENCOUNTER — Other Ambulatory Visit: Payer: Self-pay | Admitting: *Deleted

## 2018-03-03 ENCOUNTER — Other Ambulatory Visit: Payer: Managed Care, Other (non HMO)

## 2018-03-03 DIAGNOSIS — I10 Essential (primary) hypertension: Secondary | ICD-10-CM

## 2018-03-03 DIAGNOSIS — E7889 Other lipoprotein metabolism disorders: Secondary | ICD-10-CM

## 2018-03-03 DIAGNOSIS — Z0000 Encounter for general adult medical examination without abnormal findings: Secondary | ICD-10-CM

## 2018-03-04 LAB — CMP14+EGFR
ALT: 28 IU/L (ref 0–44)
AST: 27 IU/L (ref 0–40)
Albumin/Globulin Ratio: 1.6 (ref 1.2–2.2)
Albumin: 4.4 g/dL (ref 3.8–4.9)
Alkaline Phosphatase: 68 IU/L (ref 39–117)
BUN/Creatinine Ratio: 12 (ref 9–20)
BUN: 13 mg/dL (ref 6–24)
Bilirubin Total: 1.4 mg/dL — ABNORMAL HIGH (ref 0.0–1.2)
CO2: 24 mmol/L (ref 20–29)
Calcium: 9.6 mg/dL (ref 8.7–10.2)
Chloride: 105 mmol/L (ref 96–106)
Creatinine, Ser: 1.09 mg/dL (ref 0.76–1.27)
GFR calc Af Amer: 88 mL/min/{1.73_m2} (ref >59)
GFR calc non Af Amer: 77 mL/min/{1.73_m2} (ref >59)
Globulin, Total: 2.7 g/dL (ref 1.5–4.5)
Glucose: 79 mg/dL (ref 65–99)
Potassium: 4.4 mmol/L (ref 3.5–5.2)
Sodium: 144 mmol/L (ref 134–144)
TOTAL PROTEIN: 7.1 g/dL (ref 6.0–8.5)

## 2018-03-04 LAB — CBC WITH DIFFERENTIAL/PLATELET
Basophils Absolute: 0 x10E3/uL (ref 0.0–0.2)
Basos: 1 %
EOS (ABSOLUTE): 0.1 x10E3/uL (ref 0.0–0.4)
Eos: 4 %
Hematocrit: 45.5 % (ref 37.5–51.0)
Hemoglobin: 15.5 g/dL (ref 13.0–17.7)
Immature Grans (Abs): 0 x10E3/uL (ref 0.0–0.1)
Immature Granulocytes: 0 %
Lymphocytes Absolute: 0.8 x10E3/uL (ref 0.7–3.1)
Lymphs: 26 %
MCH: 30.3 pg (ref 26.6–33.0)
MCHC: 34.1 g/dL (ref 31.5–35.7)
MCV: 89 fL (ref 79–97)
Monocytes Absolute: 0.4 x10E3/uL (ref 0.1–0.9)
Monocytes: 13 %
Neutrophils Absolute: 1.8 x10E3/uL (ref 1.4–7.0)
Neutrophils: 56 %
Platelets: 269 x10E3/uL (ref 150–450)
RBC: 5.12 x10E6/uL (ref 4.14–5.80)
RDW: 13.8 % (ref 11.6–15.4)
WBC: 3.2 x10E3/uL — ABNORMAL LOW (ref 3.4–10.8)

## 2018-03-04 LAB — PSA, TOTAL AND FREE
PSA FREE: 0.23 ng/mL
PSA, Free Pct: 32.9 %
Prostate Specific Ag, Serum: 0.7 ng/mL (ref 0.0–4.0)

## 2018-03-04 LAB — LIPID PANEL
Chol/HDL Ratio: 5.2 ratio — ABNORMAL HIGH (ref 0.0–5.0)
Cholesterol, Total: 236 mg/dL — ABNORMAL HIGH (ref 100–199)
HDL: 45 mg/dL
LDL Calculated: 160 mg/dL — ABNORMAL HIGH (ref 0–99)
Triglycerides: 154 mg/dL — ABNORMAL HIGH (ref 0–149)
VLDL Cholesterol Cal: 31 mg/dL (ref 5–40)

## 2018-03-07 ENCOUNTER — Encounter: Payer: Self-pay | Admitting: Physician Assistant

## 2018-03-07 ENCOUNTER — Ambulatory Visit (INDEPENDENT_AMBULATORY_CARE_PROVIDER_SITE_OTHER): Payer: Managed Care, Other (non HMO) | Admitting: Physician Assistant

## 2018-03-07 VITALS — BP 151/89 | HR 61 | Temp 97.8°F | Ht 68.0 in | Wt 227.6 lb

## 2018-03-07 DIAGNOSIS — I10 Essential (primary) hypertension: Secondary | ICD-10-CM | POA: Diagnosis not present

## 2018-03-07 DIAGNOSIS — Z0001 Encounter for general adult medical examination with abnormal findings: Secondary | ICD-10-CM | POA: Diagnosis not present

## 2018-03-07 DIAGNOSIS — E785 Hyperlipidemia, unspecified: Secondary | ICD-10-CM

## 2018-03-07 DIAGNOSIS — M549 Dorsalgia, unspecified: Secondary | ICD-10-CM

## 2018-03-07 DIAGNOSIS — Z0000 Encounter for general adult medical examination without abnormal findings: Secondary | ICD-10-CM

## 2018-03-07 MED ORDER — LISINOPRIL 20 MG PO TABS: 20.0000 mg | ORAL_TABLET | Freq: Every day | ORAL | 3 refills | Status: DC

## 2018-03-07 MED ORDER — ATORVASTATIN CALCIUM 10 MG PO TABS: 10.0000 mg | ORAL_TABLET | Freq: Every day | ORAL | 3 refills | Status: DC

## 2018-03-07 NOTE — Patient Instructions (Signed)

## 2018-03-08 DIAGNOSIS — E785 Hyperlipidemia, unspecified: Secondary | ICD-10-CM | POA: Insufficient documentation

## 2018-03-08 NOTE — Progress Notes (Signed)
BP (!) 151/89   Pulse 61   Temp 97.8 F (36.6 C) (Oral)   Ht 5' 8"  (1.727 m)   Wt 227 lb 9.6 oz (103.2 kg)   BMI 34.61 kg/m    Subjective:    Patient ID: Matthew Weber, male    DOB: 10-14-1965, 53 y.o.   MRN: [id number]  HPI: Matthew Weber is a 53 y.o. male presenting on 03/07/2018 for Annual Exam  This patient comes in for annual well physical examination. All medications are reviewed today. There are no reports of any problems with the medications. All of the medical conditions are reviewed and updated.  Lab work is reviewed and will be ordered as medically necessary. There are no new problems reported with today's visit.  Patient reports doing well overall.   Past Medical History:  Diagnosis Date  . Hypertension    Relevant past medical, surgical, family and social history reviewed and updated as indicated. Interim medical history since our last visit reviewed. Allergies and medications reviewed and updated. DATA REVIEWED: CHART IN EPIC  Family History reviewed for pertinent findings.  Review of Systems  Constitutional: Negative.  Negative for appetite change and fatigue.  HENT: Negative.   Eyes: Negative.  Negative for pain and visual disturbance.  Respiratory: Negative.  Negative for cough, chest tightness, shortness of breath and wheezing.   Cardiovascular: Negative.  Negative for chest pain, palpitations and leg swelling.  Gastrointestinal: Negative.  Negative for abdominal pain, diarrhea, nausea and vomiting.  Endocrine: Negative.   Genitourinary: Negative.   Musculoskeletal: Positive for back pain and joint swelling.  Skin: Negative.  Negative for color change and rash.  Neurological: Negative.  Negative for weakness, numbness and headaches.  Psychiatric/Behavioral: Negative.     Allergies as of 03/07/2018   No Known Allergies     Medication List       Accurate as of March 07, 2018 11:59 PM. Always use your most recent med list.        amLODipine 10 MG  tablet Commonly known as:  NORVASC TAKE 1 TABLET BY MOUTH ONCE DAILY   aspirin 325 MG tablet Take 325 mg by mouth daily.   atorvastatin 10 MG tablet Commonly known as:  LIPITOR Take 1 tablet (10 mg total) by mouth daily.   CORICIDIN HBP COUGH/COLD PO Take by mouth.   Fish Oil 1200 MG Caps Take 1,200 mg by mouth daily.   Garlic 7680 MG Caps Take 1,000 mg by mouth 2 (two) times daily.   lisinopril 20 MG tablet Commonly known as:  PRINIVIL,ZESTRIL Take 1 tablet (20 mg total) by mouth daily.   loratadine 10 MG tablet Commonly known as:  CLARITIN Take 1 tablet (10 mg total) by mouth daily.   losartan 100 MG tablet Commonly known as:  COZAAR TAKE 1 TABLET BY MOUTH ONCE DAILY   Magnesium 250 MG Tabs Take 250 mg by mouth 2 (two) times daily.          Objective:    BP (!) 151/89   Pulse 61   Temp 97.8 F (36.6 C) (Oral)   Ht 5' 8"  (1.727 m)   Wt 227 lb 9.6 oz (103.2 kg)   BMI 34.61 kg/m   No Known Allergies  Wt Readings from Last 3 Encounters:  03/07/18 227 lb 9.6 oz (103.2 kg)  02/20/17 215 lb 9.6 oz (97.8 kg)  08/17/16 205 lb 3.2 oz (93.1 kg)    Physical Exam Constitutional:      Appearance: He  is well-developed.  HENT:     Head: Normocephalic and atraumatic.  Eyes:     Conjunctiva/sclera: Conjunctivae normal.     Pupils: Pupils are equal, round, and reactive to light.  Neck:     Musculoskeletal: Normal range of motion and neck supple.  Cardiovascular:     Rate and Rhythm: Normal rate and regular rhythm.     Heart sounds: Normal heart sounds.  Pulmonary:     Effort: Pulmonary effort is normal.     Breath sounds: Normal breath sounds.  Abdominal:     General: Bowel sounds are normal.     Palpations: Abdomen is soft.  Musculoskeletal: Normal range of motion.  Skin:    General: Skin is warm and dry.     Results for orders placed or performed in visit on 03/03/18  PSA, total and free  Result Value Ref Range   Prostate Specific Ag, Serum 0.7 0.0  - 4.0 ng/mL   PSA, Free 0.23 N/A ng/mL   PSA, Free Pct 32.9 %  Lipid panel  Result Value Ref Range   Cholesterol, Total 236 (H) 100 - 199 mg/dL   Triglycerides 154 (H) 0 - 149 mg/dL   HDL 45 >39 mg/dL   VLDL Cholesterol Cal 31 5 - 40 mg/dL   LDL Calculated 160 (H) 0 - 99 mg/dL   Chol/HDL Ratio 5.2 (H) 0.0 - 5.0 ratio  CMP14+EGFR  Result Value Ref Range   Glucose 79 65 - 99 mg/dL   BUN 13 6 - 24 mg/dL   Creatinine, Ser 1.09 0.76 - 1.27 mg/dL   GFR calc non Af Amer 77 >59 mL/min/1.73   GFR calc Af Amer 88 >59 mL/min/1.73   BUN/Creatinine Ratio 12 9 - 20   Sodium 144 134 - 144 mmol/L   Potassium 4.4 3.5 - 5.2 mmol/L   Chloride 105 96 - 106 mmol/L   CO2 24 20 - 29 mmol/L   Calcium 9.6 8.7 - 10.2 mg/dL   Total Protein 7.1 6.0 - 8.5 g/dL   Albumin 4.4 3.8 - 4.9 g/dL   Globulin, Total 2.7 1.5 - 4.5 g/dL   Albumin/Globulin Ratio 1.6 1.2 - 2.2   Bilirubin Total 1.4 (H) 0.0 - 1.2 mg/dL   Alkaline Phosphatase 68 39 - 117 IU/L   AST 27 0 - 40 IU/L   ALT 28 0 - 44 IU/L  CBC with Differential/Platelet  Result Value Ref Range   WBC 3.2 (L) 3.4 - 10.8 x10E3/uL   RBC 5.12 4.14 - 5.80 x10E6/uL   Hemoglobin 15.5 13.0 - 17.7 g/dL   Hematocrit 45.5 37.5 - 51.0 %   MCV 89 79 - 97 fL   MCH 30.3 26.6 - 33.0 pg   MCHC 34.1 31.5 - 35.7 g/dL   RDW 13.8 11.6 - 15.4 %   Platelets 269 150 - 450 x10E3/uL   Neutrophils 56 Not Estab. %   Lymphs 26 Not Estab. %   Monocytes 13 Not Estab. %   Eos 4 Not Estab. %   Basos 1 Not Estab. %   Neutrophils Absolute 1.8 1.4 - 7.0 x10E3/uL   Lymphocytes Absolute 0.8 0.7 - 3.1 x10E3/uL   Monocytes Absolute 0.4 0.1 - 0.9 x10E3/uL   EOS (ABSOLUTE) 0.1 0.0 - 0.4 x10E3/uL   Basophils Absolute 0.0 0.0 - 0.2 x10E3/uL   Immature Granulocytes 0 Not Estab. %   Immature Grans (Abs) 0.0 0.0 - 0.1 x10E3/uL      Assessment & Plan:   1. Well  adult exam Recheck one year Labs discussed  2. Essential hypertension - lisinopril (PRINIVIL,ZESTRIL) 20 MG tablet; Take 1  tablet (20 mg total) by mouth daily.  Dispense: 90 tablet; Refill: 3  3. Hyperlipidemia, unspecified hyperlipidemia type - atorvastatin (LIPITOR) 10 MG tablet; Take 1 tablet (10 mg total) by mouth daily.  Dispense: 90 tablet; Refill: 3  4. Acute midline back pain, unspecified back location Back exercises   Continue all other maintenance medications as listed above.  Follow up plan: Return in about 3 months (around 06/05/2018) for labs.  Educational handout given for back [place] PA-C [facility] [address]  [place], [place] [address] [phone]   03/08/2018, 7:28 PM

## 2018-06-04 ENCOUNTER — Other Ambulatory Visit: Payer: Self-pay

## 2018-06-05 ENCOUNTER — Encounter: Payer: Self-pay | Admitting: Physician Assistant

## 2018-06-05 ENCOUNTER — Ambulatory Visit (INDEPENDENT_AMBULATORY_CARE_PROVIDER_SITE_OTHER): Payer: Managed Care, Other (non HMO) | Admitting: Physician Assistant

## 2018-06-05 VITALS — BP 164/94 | HR 63 | Temp 97.8°F | Ht 68.0 in | Wt 221.0 lb

## 2018-06-05 DIAGNOSIS — I10 Essential (primary) hypertension: Secondary | ICD-10-CM | POA: Diagnosis not present

## 2018-06-05 DIAGNOSIS — E785 Hyperlipidemia, unspecified: Secondary | ICD-10-CM | POA: Diagnosis not present

## 2018-06-05 MED ORDER — AMLODIPINE BESYLATE 10 MG PO TABS: 10.0000 mg | ORAL_TABLET | Freq: Every day | ORAL | 1 refills | Status: DC

## 2018-06-05 NOTE — Progress Notes (Signed)
BP (!) 164/94   Pulse 63   Temp 97.8 F (36.6 C) (Oral)   Ht _0  (1.727 m)   Wt 221 lb (100.2 kg)   BMI 33.60 kg/m    Subjective:    Patient ID: Matthew Weber, male    DOB: 02-13-1965, 53 y.o.   MRN: [id number]  HPI: Matthew Weber is a 53 y.o. male presenting on 06/05/2018 for Hypertension  Comes in for recheck on his high blood pressure.  He had to be changed from losartan.  They could not get it in the store.  He went to lisinopril 20 mg and has not had complete control with this medication.  We are going to have him restart the amlodipine, this was when he had taken before.  He was very tolerant of it.  He states that overall he is doing fairly well.  He is working, he is not had to go out on short time.  He states that he is a little bit stress and not eating and exercising as well as he had in the past.  And he wants to get back doing this.  Past Medical History:  Diagnosis Date  . Hypertension    Relevant past medical, surgical, family and social history reviewed and updated as indicated. Interim medical history since our last visit reviewed. Allergies and medications reviewed and updated. DATA REVIEWED: CHART IN EPIC  Family History reviewed for pertinent findings.  Review of Systems  Constitutional: Negative.  Negative for appetite change and fatigue.  Eyes: Negative for pain and visual disturbance.  Respiratory: Negative.  Negative for cough, chest tightness, shortness of breath and wheezing.   Cardiovascular: Negative.  Negative for chest pain, palpitations and leg swelling.  Gastrointestinal: Negative.  Negative for abdominal pain, diarrhea, nausea and vomiting.  Genitourinary: Negative.   Musculoskeletal: Positive for back pain.  Skin: Negative.  Negative for color change and rash.  Neurological: Negative.  Negative for weakness, numbness and headaches.  Psychiatric/Behavioral: Negative.     Allergies as of 06/05/2018   No Known Allergies     Medication List      Accurate as of Jun 05, 2018  4:32 PM. If you have any questions, ask your nurse or doctor.        STOP taking these medications   losartan 100 MG tablet Commonly known as:  COZAAR Stopped by:  Terald Sleeper, PA-C     TAKE these medications   amLODipine 10 MG tablet Commonly known as:  NORVASC Take 1 tablet (10 mg total) by mouth daily.   aspirin 325 MG tablet Take 325 mg by mouth daily.   atorvastatin 10 MG tablet Commonly known as:  Lipitor Take 1 tablet (10 mg total) by mouth daily.   CORICIDIN HBP COUGH/COLD PO Take by mouth.   Fish Oil 1200 MG Caps Take 1,200 mg by mouth daily.   Garlic 5110 MG Caps Take 1,000 mg by mouth 2 (two) times daily.   lisinopril 20 MG tablet Commonly known as:  ZESTRIL Take 1 tablet (20 mg total) by mouth daily.   loratadine 10 MG tablet Commonly known as:  Claritin Take 1 tablet (10 mg total) by mouth daily.   Magnesium 250 MG Tabs Take 250 mg by mouth 2 (two) times daily.          Objective:    BP (!) 164/94   Pulse 63   Temp 97.8 F (36.6 C) (Oral)   Ht _1  (1.727 m)  Wt 221 lb (100.2 kg)   BMI 33.60 kg/m   No Known Allergies  Wt Readings from Last 3 Encounters:  06/05/18 221 lb (100.2 kg)  03/07/18 227 lb 9.6 oz (103.2 kg)  02/20/17 215 lb 9.6 oz (97.8 kg)    Physical Exam Vitals signs and nursing note reviewed.  Constitutional:      General: He is not in acute distress.    Appearance: He is well-developed.  HENT:     Head: Normocephalic and atraumatic.  Eyes:     Conjunctiva/sclera: Conjunctivae normal.     Pupils: Pupils are equal, round, and reactive to light.  Cardiovascular:     Rate and Rhythm: Normal rate and regular rhythm.     Heart sounds: Normal heart sounds.  Pulmonary:     Effort: Pulmonary effort is normal. No respiratory distress.     Breath sounds: Normal breath sounds.  Skin:    General: Skin is warm and dry.  Psychiatric:        Behavior: Behavior normal.     Results for  orders placed or performed in visit on 03/03/18  PSA, total and free  Result Value Ref Range   Prostate Specific Ag, Serum 0.7 0.0 - 4.0 ng/mL   PSA, Free 0.23 N/A ng/mL   PSA, Free Pct 32.9 %  Lipid panel  Result Value Ref Range   Cholesterol, Total 236 (H) 100 - 199 mg/dL   Triglycerides 154 (H) 0 - 149 mg/dL   HDL 45 >39 mg/dL   VLDL Cholesterol Cal 31 5 - 40 mg/dL   LDL Calculated 160 (H) 0 - 99 mg/dL   Chol/HDL Ratio 5.2 (H) 0.0 - 5.0 ratio  CMP14+EGFR  Result Value Ref Range   Glucose 79 65 - 99 mg/dL   BUN 13 6 - 24 mg/dL   Creatinine, Ser 1.09 0.76 - 1.27 mg/dL   GFR calc non Af Amer 77 >59 mL/min/1.73   GFR calc Af Amer 88 >59 mL/min/1.73   BUN/Creatinine Ratio 12 9 - 20   Sodium 144 134 - 144 mmol/L   Potassium 4.4 3.5 - 5.2 mmol/L   Chloride 105 96 - 106 mmol/L   CO2 24 20 - 29 mmol/L   Calcium 9.6 8.7 - 10.2 mg/dL   Total Protein 7.1 6.0 - 8.5 g/dL   Albumin 4.4 3.8 - 4.9 g/dL   Globulin, Total 2.7 1.5 - 4.5 g/dL   Albumin/Globulin Ratio 1.6 1.2 - 2.2   Bilirubin Total 1.4 (H) 0.0 - 1.2 mg/dL   Alkaline Phosphatase 68 39 - 117 IU/L   AST 27 0 - 40 IU/L   ALT 28 0 - 44 IU/L  CBC with Differential/Platelet  Result Value Ref Range   WBC 3.2 (L) 3.4 - 10.8 x10E3/uL   RBC 5.12 4.14 - 5.80 x10E6/uL   Hemoglobin 15.5 13.0 - 17.7 g/dL   Hematocrit 45.5 37.5 - 51.0 %   MCV 89 79 - 97 fL   MCH 30.3 26.6 - 33.0 pg   MCHC 34.1 31.5 - 35.7 g/dL   RDW 13.8 11.6 - 15.4 %   Platelets 269 150 - 450 x10E3/uL   Neutrophils 56 Not Estab. %   Lymphs 26 Not Estab. %   Monocytes 13 Not Estab. %   Eos 4 Not Estab. %   Basos 1 Not Estab. %   Neutrophils Absolute 1.8 1.4 - 7.0 x10E3/uL   Lymphocytes Absolute 0.8 0.7 - 3.1 x10E3/uL   Monocytes Absolute 0.4 0.1 -  0.9 x10E3/uL   EOS (ABSOLUTE) 0.1 0.0 - 0.4 x10E3/uL   Basophils Absolute 0.0 0.0 - 0.2 x10E3/uL   Immature Granulocytes 0 Not Estab. %   Immature Grans (Abs) 0.0 0.0 - 0.1 x10E3/uL      Assessment & Plan:   1.  Essential hypertension - amLODipine (NORVASC) 10 MG tablet; Take 1 tablet (10 mg total) by mouth daily.  Dispense: 90 tablet; Refill: 1 - CMP14+EGFR  2. Hyperlipidemia, unspecified hyperlipidemia type Diet and exercise discussed - Lipid panel   Continue all other maintenance medications as listed above.  Follow up plan: Return in about 3 months (around 09/05/2018) for recheck.  Educational handout given for [place] PA-C [facility] [address]  [place], [place] [address] [phone]   06/05/2018, 4:32 PM

## 2018-06-05 NOTE — Patient Instructions (Signed)
DASH Eating Plan  DASH stands for "Dietary Approaches to Stop Hypertension." The DASH eating plan is a healthy eating plan that has been shown to reduce high blood pressure (hypertension). It may also reduce your risk for type 2 diabetes, heart disease, and stroke. The DASH eating plan may also help with weight loss.  What are tips for following this plan?    General guidelines   Avoid eating more than 2,300 mg (milligrams) of salt (sodium) a day. If you have hypertension, you may need to reduce your sodium intake to 1,500 mg a day.   Limit alcohol intake to no more than 1 drink a day for nonpregnant women and 2 drinks a day for men. One drink equals 12 oz of beer, 5 oz of wine, or 1 oz of hard liquor.   Work with your health care provider to maintain a healthy body weight or to lose weight. Ask what an ideal weight is for you.   Get at least 30 minutes of exercise that causes your heart to beat faster (aerobic exercise) most days of the week. Activities may include walking, swimming, or biking.   Work with your health care provider or diet and nutrition specialist (dietitian) to adjust your eating plan to your individual calorie needs.  Reading food labels     Check food labels for the amount of sodium per serving. Choose foods with less than 5 percent of the Daily Value of sodium. Generally, foods with less than 300 mg of sodium per serving fit into this eating plan.   To find whole grains, look for the word "whole" as the first word in the ingredient list.  Shopping   Buy products labeled as "low-sodium" or "no salt added."   Buy fresh foods. Avoid canned foods and premade or frozen meals.  Cooking   Avoid adding salt when cooking. Use salt-free seasonings or herbs instead of table salt or sea salt. Check with your health care provider or pharmacist before using salt substitutes.   Do not fry foods. Cook foods using healthy methods such as baking, boiling, grilling, and broiling instead.   Cook with  heart-healthy oils, such as olive, canola, soybean, or sunflower oil.  Meal planning   Eat a balanced diet that includes:  ? 5 or more servings of fruits and vegetables each day. At each meal, try to fill half of your plate with fruits and vegetables.  ? Up to 6-8 servings of whole grains each day.  ? Less than 6 oz of lean meat, poultry, or fish each day. A 3-oz serving of meat is about the same size as a deck of cards. One egg equals 1 oz.  ? 2 servings of low-fat dairy each day.  ? A serving of nuts, seeds, or beans 5 times each week.  ? Heart-healthy fats. Healthy fats called Omega-3 fatty acids are found in foods such as flaxseeds and coldwater fish, like sardines, salmon, and mackerel.   Limit how much you eat of the following:  ? Canned or prepackaged foods.  ? Food that is high in trans fat, such as fried foods.  ? Food that is high in saturated fat, such as fatty meat.  ? Sweets, desserts, sugary drinks, and other foods with added sugar.  ? Full-fat dairy products.   Do not salt foods before eating.   Try to eat at least 2 vegetarian meals each week.   Eat more home-cooked food and less restaurant, buffet, and fast food.     When eating at a restaurant, ask that your food be prepared with less salt or no salt, if possible.  What foods are recommended?  The items listed may not be a complete list. Talk with your dietitian about what dietary choices are best for you.  Grains  Whole-grain or whole-wheat bread. Whole-grain or whole-wheat pasta. Brown rice. Oatmeal. Quinoa. Bulgur. Whole-grain and low-sodium cereals. Pita bread. Low-fat, low-sodium crackers. Whole-wheat flour tortillas.  Vegetables  Fresh or frozen vegetables (raw, steamed, roasted, or grilled). Low-sodium or reduced-sodium tomato and vegetable juice. Low-sodium or reduced-sodium tomato sauce and tomato paste. Low-sodium or reduced-sodium canned vegetables.  Fruits  All fresh, dried, or frozen fruit. Canned fruit in natural juice (without  added sugar).  Meat and other protein foods  Skinless chicken or turkey. Ground chicken or turkey. Pork with fat trimmed off. Fish and seafood. Egg whites. Dried beans, peas, or lentils. Unsalted nuts, nut butters, and seeds. Unsalted canned beans. Lean cuts of beef with fat trimmed off. Low-sodium, lean deli meat.  Dairy  Low-fat (1%) or fat-free (skim) milk. Fat-free, low-fat, or reduced-fat cheeses. Nonfat, low-sodium ricotta or cottage cheese. Low-fat or nonfat yogurt. Low-fat, low-sodium cheese.  Fats and oils  Soft margarine without trans fats. Vegetable oil. Low-fat, reduced-fat, or light mayonnaise and salad dressings (reduced-sodium). Canola, safflower, olive, soybean, and sunflower oils. Avocado.  Seasoning and other foods  Herbs. Spices. Seasoning mixes without salt. Unsalted popcorn and pretzels. Fat-free sweets.  What foods are not recommended?  The items listed may not be a complete list. Talk with your dietitian about what dietary choices are best for you.  Grains  Baked goods made with fat, such as croissants, muffins, or some breads. Dry pasta or rice meal packs.  Vegetables  Creamed or fried vegetables. Vegetables in a cheese sauce. Regular canned vegetables (not low-sodium or reduced-sodium). Regular canned tomato sauce and paste (not low-sodium or reduced-sodium). Regular tomato and vegetable juice (not low-sodium or reduced-sodium). Pickles. Olives.  Fruits  Canned fruit in a light or heavy syrup. Fried fruit. Fruit in cream or butter sauce.  Meat and other protein foods  Fatty cuts of meat. Ribs. Fried meat. Bacon. Sausage. Bologna and other processed lunch meats. Salami. Fatback. Hotdogs. Bratwurst. Salted nuts and seeds. Canned beans with added salt. Canned or smoked fish. Whole eggs or egg yolks. Chicken or turkey with skin.  Dairy  Whole or 2% milk, cream, and half-and-half. Whole or full-fat cream cheese. Whole-fat or sweetened yogurt. Full-fat cheese. Nondairy creamers. Whipped toppings.  Processed cheese and cheese spreads.  Fats and oils  Butter. Stick margarine. Lard. Shortening. Ghee. Bacon fat. Tropical oils, such as coconut, palm kernel, or palm oil.  Seasoning and other foods  Salted popcorn and pretzels. Onion salt, garlic salt, seasoned salt, table salt, and sea salt. Worcestershire sauce. Tartar sauce. Barbecue sauce. Teriyaki sauce. Soy sauce, including reduced-sodium. Steak sauce. Canned and packaged gravies. Fish sauce. Oyster sauce. Cocktail sauce. Horseradish that you find on the shelf. Ketchup. Mustard. Meat flavorings and tenderizers. Bouillon cubes. Hot sauce and Tabasco sauce. Premade or packaged marinades. Premade or packaged taco seasonings. Relishes. Regular salad dressings.  Where to find more information:   National Heart, Lung, and Blood Institute: www.nhlbi.nih.gov   American Heart Association: www.heart.org  Summary   The DASH eating plan is a healthy eating plan that has been shown to reduce high blood pressure (hypertension). It may also reduce your risk for type 2 diabetes, heart disease, and stroke.   With the   DASH eating plan, you should limit salt (sodium) intake to 2,300 mg a day. If you have hypertension, you may need to reduce your sodium intake to 1,500 mg a day.   When on the DASH eating plan, aim to eat more fresh fruits and vegetables, whole grains, lean proteins, low-fat dairy, and heart-healthy fats.   Work with your health care provider or diet and nutrition specialist (dietitian) to adjust your eating plan to your individual calorie needs.  This information is not intended to replace advice given to you by your health care provider. Make sure you discuss any questions you have with your health care provider.  Document Released: 12/30/2010 Document Revised: 01/04/2016 Document Reviewed: 01/04/2016  Elsevier Interactive Patient Education  2019 [employer] Inc[employer]

## 2018-06-06 LAB — CMP14+EGFR
ALT: 43 IU/L (ref 0–44)
AST: 39 IU/L (ref 0–40)
Albumin/Globulin Ratio: 1.9 (ref 1.2–2.2)
Albumin: 4.6 g/dL (ref 3.8–4.9)
Alkaline Phosphatase: 82 IU/L (ref 39–117)
BUN/Creatinine Ratio: 17 (ref 9–20)
BUN: 17 mg/dL (ref 6–24)
Bilirubin Total: 1.3 mg/dL — ABNORMAL HIGH (ref 0.0–1.2)
CO2: 21 mmol/L (ref 20–29)
Calcium: 9.8 mg/dL (ref 8.7–10.2)
Chloride: 105 mmol/L (ref 96–106)
Creatinine, Ser: 1.03 mg/dL (ref 0.76–1.27)
GFR calc Af Amer: 95 mL/min/{1.73_m2} (ref >59)
GFR calc non Af Amer: 82 mL/min/{1.73_m2} (ref >59)
Globulin, Total: 2.4 g/dL (ref 1.5–4.5)
Glucose: 85 mg/dL (ref 65–99)
Potassium: 4.4 mmol/L (ref 3.5–5.2)
Sodium: 142 mmol/L (ref 134–144)
Total Protein: 7 g/dL (ref 6.0–8.5)

## 2018-06-06 LAB — LIPID PANEL
Chol/HDL Ratio: 3.1 ratio (ref 0.0–5.0)
Cholesterol, Total: 163 mg/dL (ref 100–199)
HDL: 52 mg/dL (ref >39)
LDL Calculated: 88 mg/dL (ref 0–99)
Triglycerides: 113 mg/dL (ref 0–149)
VLDL Cholesterol Cal: 23 mg/dL (ref 5–40)

## 2018-06-06 NOTE — Progress Notes (Signed)
Left message to please call our office. 

## 2018-09-06 ENCOUNTER — Ambulatory Visit: Payer: Managed Care, Other (non HMO) | Admitting: Physician Assistant

## 2018-09-06 ENCOUNTER — Encounter: Payer: Self-pay | Admitting: Physician Assistant

## 2018-09-06 ENCOUNTER — Other Ambulatory Visit: Payer: Self-pay

## 2018-09-06 DIAGNOSIS — I10 Essential (primary) hypertension: Secondary | ICD-10-CM

## 2018-09-06 DIAGNOSIS — E785 Hyperlipidemia, unspecified: Secondary | ICD-10-CM

## 2018-09-06 MED ORDER — LISINOPRIL 20 MG PO TABS: 20.0000 mg | ORAL_TABLET | Freq: Every day | ORAL | 3 refills | Status: DC

## 2018-09-06 MED ORDER — ATORVASTATIN CALCIUM 10 MG PO TABS: 10.0000 mg | ORAL_TABLET | Freq: Every day | ORAL | 3 refills | Status: DC

## 2018-09-06 MED ORDER — AMLODIPINE BESYLATE 10 MG PO TABS: 10.0000 mg | ORAL_TABLET | Freq: Every day | ORAL | 1 refills | Status: DC

## 2018-09-06 MED ORDER — AMLODIPINE BESYLATE 10 MG PO TABS: 10.0000 mg | ORAL_TABLET | Freq: Every day | ORAL | 3 refills | Status: DC

## 2018-09-10 NOTE — Progress Notes (Signed)
BP 135/89   Pulse (!) 50   Temp 98.2 F (36.8 C) (Temporal)   Ht _0  (1.727 m)   Wt 222 lb 9.6 oz (101 kg)   BMI 33.85 kg/m    Subjective:    Patient ID: Matthew Weber, male    DOB: 30-Apr-1965, 53 y.o.   MRN: [id number]  HPI: Matthew Weber is a 53 y.o. male presenting on 09/06/2018 for Hypertension (3 month ) and Hyperlipidemia  This patient comes in for follow-up on his chronic medical conditions.  They do include hypertension and hyperlipidemia.  We have made an adjustment in his blood pressure medicine.  He has gone up to amlodipine 10 mg 1 daily.  He states that he is tolerating it very well and not having any difficulty.  He has had good blood pressure readings at home.  He was very good today.  Patient has also been taking his atorvastatin.  We reviewed his labs and it has gotten his lipid panel into normal range.  Past Medical History:  Diagnosis Date  . Hypertension    Relevant past medical, surgical, family and social history reviewed and updated as indicated. Interim medical history since our last visit reviewed. Allergies and medications reviewed and updated. DATA REVIEWED: CHART IN EPIC  Family History reviewed for pertinent findings.  Review of Systems  Constitutional: Negative.  Negative for appetite change and fatigue.  Eyes: Negative for pain and visual disturbance.  Respiratory: Negative.  Negative for cough, chest tightness, shortness of breath and wheezing.   Cardiovascular: Negative.  Negative for chest pain, palpitations and leg swelling.  Gastrointestinal: Negative.  Negative for abdominal pain, diarrhea, nausea and vomiting.  Genitourinary: Negative.   Skin: Negative.  Negative for color change and rash.  Neurological: Negative.  Negative for weakness, numbness and headaches.  Psychiatric/Behavioral: Negative.     Allergies as of 09/06/2018   No Known Allergies     Medication List       Accurate as of September 06, 2018 11:59 PM. If you have any  questions, ask your nurse or doctor.        amLODipine 10 MG tablet Commonly known as: NORVASC Take 1 tablet (10 mg total) by mouth daily.   aspirin 325 MG tablet Take 325 mg by mouth daily.   atorvastatin 10 MG tablet Commonly known as: Lipitor Take 1 tablet (10 mg total) by mouth daily.   CORICIDIN HBP COUGH/COLD PO Take by mouth.   Fish Oil 1200 MG Caps Take 1,200 mg by mouth daily.   Garlic 0388 MG Caps Take 1,000 mg by mouth 2 (two) times daily.   lisinopril 20 MG tablet Commonly known as: ZESTRIL Take 1 tablet (20 mg total) by mouth daily.   loratadine 10 MG tablet Commonly known as: Claritin Take 1 tablet (10 mg total) by mouth daily.   Magnesium 250 MG Tabs Take 250 mg by mouth 2 (two) times daily.          Objective:    BP 135/89   Pulse (!) 50   Temp 98.2 F (36.8 C) (Temporal)   Ht _1  (1.727 m)   Wt 222 lb 9.6 oz (101 kg)   BMI 33.85 kg/m   No Known Allergies  Wt Readings from Last 3 Encounters:  09/06/18 222 lb 9.6 oz (101 kg)  06/05/18 221 lb (100.2 kg)  03/07/18 227 lb 9.6 oz (103.2 kg)    Physical Exam Vitals signs and nursing note reviewed.  Constitutional:      General: He is not in acute distress.    Appearance: He is well-developed.  HENT:     Head: Normocephalic and atraumatic.  Eyes:     Conjunctiva/sclera: Conjunctivae normal.     Pupils: Pupils are equal, round, and reactive to light.  Cardiovascular:     Rate and Rhythm: Normal rate and regular rhythm.     Heart sounds: Normal heart sounds.  Pulmonary:     Effort: Pulmonary effort is normal. No respiratory distress.     Breath sounds: Normal breath sounds.  Skin:    General: Skin is warm and dry.  Psychiatric:        Behavior: Behavior normal.     Results for orders placed or performed in visit on 06/05/18  Lipid panel  Result Value Ref Range   Cholesterol, Total 163 100 - 199 mg/dL   Triglycerides 113 0 - 149 mg/dL   HDL 52 >39 mg/dL   VLDL Cholesterol  Cal 23 5 - 40 mg/dL   LDL Calculated 88 0 - 99 mg/dL   Chol/HDL Ratio 3.1 0.0 - 5.0 ratio  CMP14+EGFR  Result Value Ref Range   Glucose 85 65 - 99 mg/dL   BUN 17 6 - 24 mg/dL   Creatinine, Ser 1.03 0.76 - 1.27 mg/dL   GFR calc non Af Amer 82 >59 mL/min/1.73   GFR calc Af Amer 95 >59 mL/min/1.73   BUN/Creatinine Ratio 17 9 - 20   Sodium 142 134 - 144 mmol/L   Potassium 4.4 3.5 - 5.2 mmol/L   Chloride 105 96 - 106 mmol/L   CO2 21 20 - 29 mmol/L   Calcium 9.8 8.7 - 10.2 mg/dL   Total Protein 7.0 6.0 - 8.5 g/dL   Albumin 4.6 3.8 - 4.9 g/dL   Globulin, Total 2.4 1.5 - 4.5 g/dL   Albumin/Globulin Ratio 1.9 1.2 - 2.2   Bilirubin Total 1.3 (H) 0.0 - 1.2 mg/dL   Alkaline Phosphatase 82 39 - 117 IU/L   AST 39 0 - 40 IU/L   ALT 43 0 - 44 IU/L      Assessment & Plan:   1. Essential hypertension - lisinopril (ZESTRIL) 20 MG tablet; Take 1 tablet (20 mg total) by mouth daily.  Dispense: 90 tablet; Refill: 3 - amLODipine (NORVASC) 10 MG tablet; Take 1 tablet (10 mg total) by mouth daily.  Dispense: 90 tablet; Refill: 3  2. Hyperlipidemia, unspecified hyperlipidemia type - atorvastatin (LIPITOR) 10 MG tablet; Take 1 tablet (10 mg total) by mouth daily.  Dispense: 90 tablet; Refill: 3   Continue all other maintenance medications as listed above.  Follow up plan: No follow-ups on file.  Educational handout given for [place] PA-C [facility] [address]  [place], [place] [address] [phone]   09/10/2018, 8:07 AM

## 2018-12-07 ENCOUNTER — Other Ambulatory Visit: Payer: Self-pay

## 2018-12-10 ENCOUNTER — Ambulatory Visit: Payer: Managed Care, Other (non HMO) | Admitting: Physician Assistant

## 2019-01-10 IMAGING — US US SOFT TISSUE HEAD/NECK
1 series · 9 of 9 positions shown · non-contrast
Comparison: None.

CLINICAL DATA: 51-year-old male with a history of lump on the neck

EXAM:
ULTRASOUND OF HEAD/NECK SOFT TISSUES
TECHNIQUE: Ultrasound examination of the head and neck soft tissues was
performed in the area of clinical concern.

[Series 1: us soft tissue head/neck · 0.06mm/px · 9 of 9 slices shown]
[im 1/9]
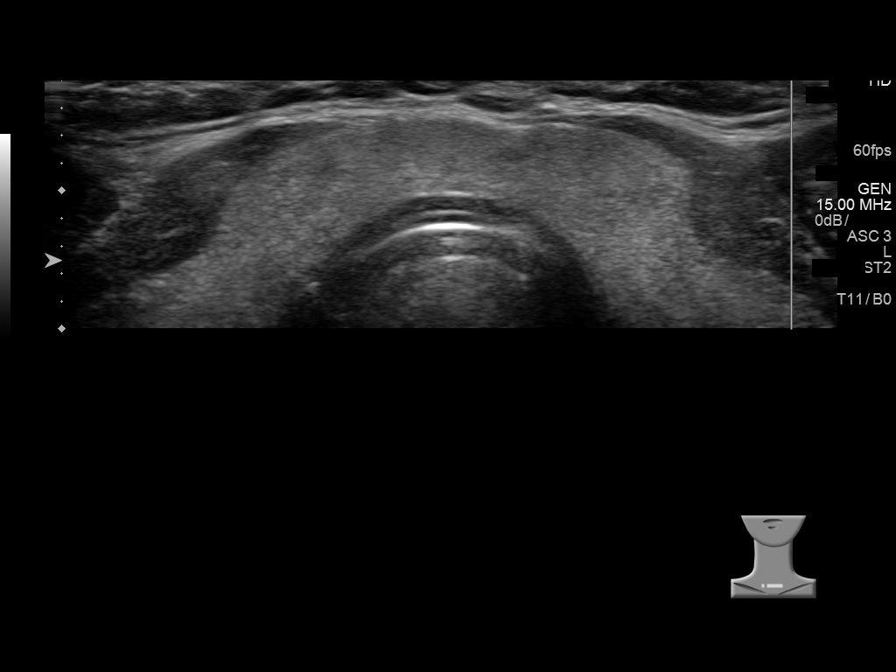
[im 2/9]
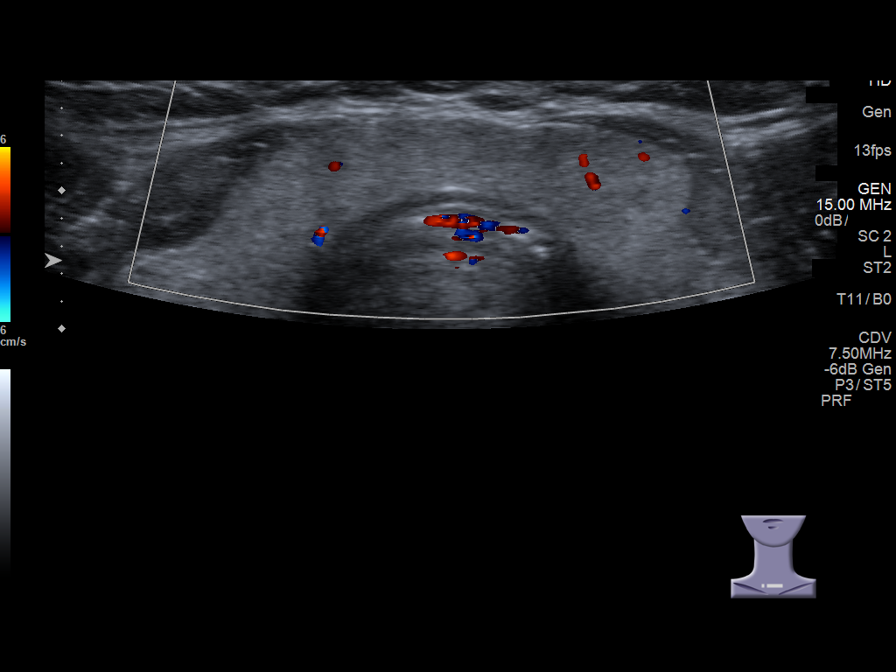
[im 3/9]
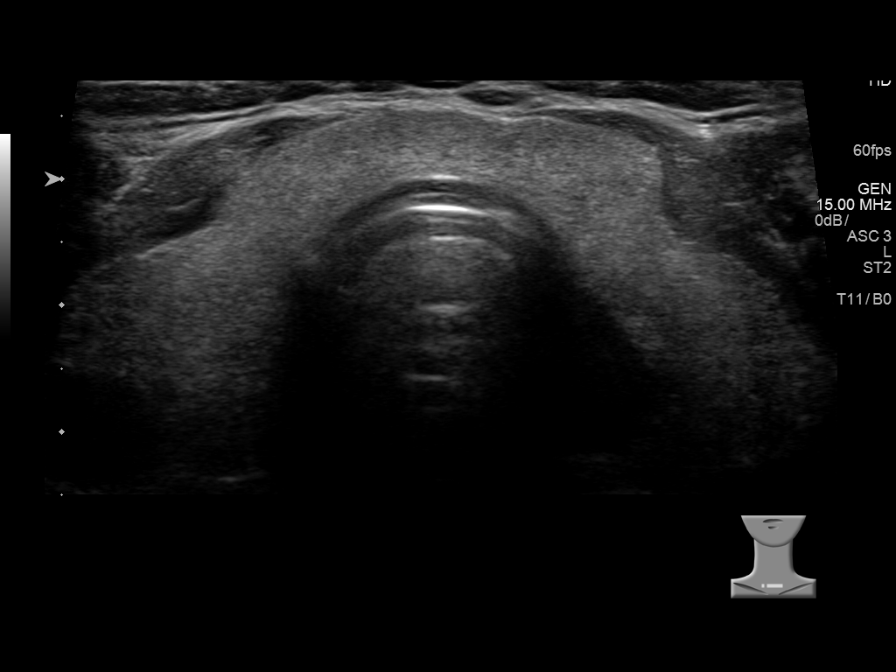
[im 4/9]
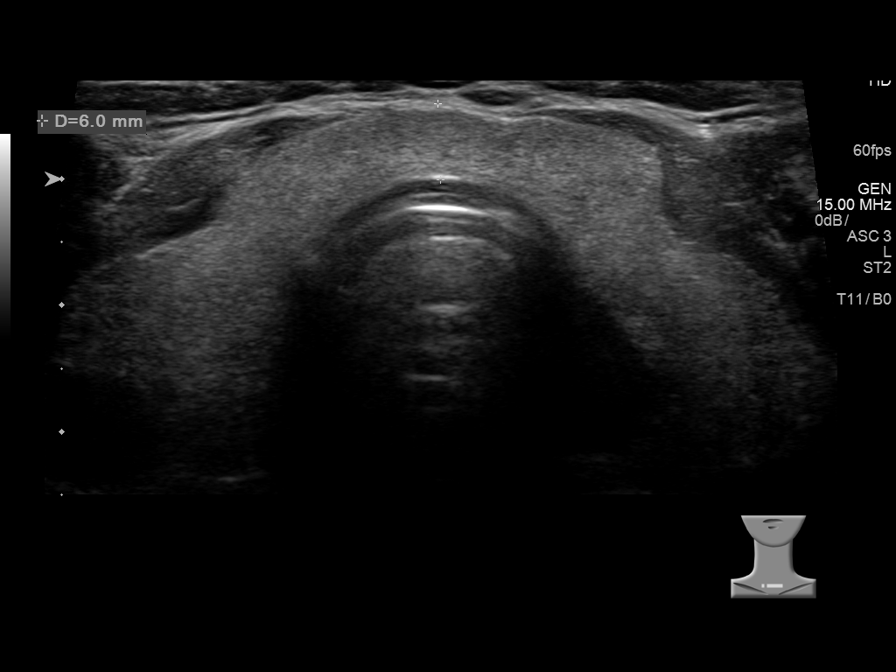
[im 5/9]
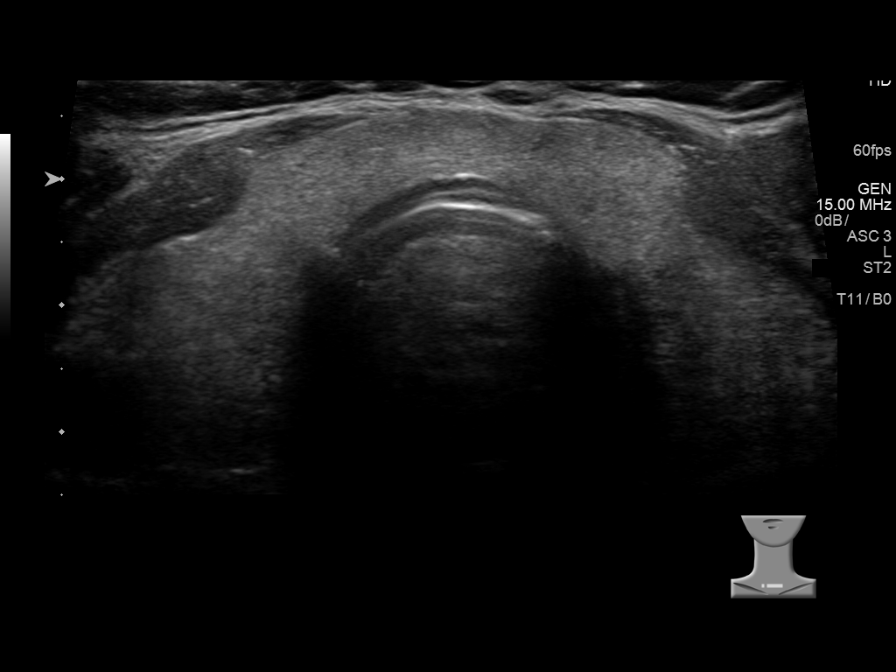
[im 6/9]
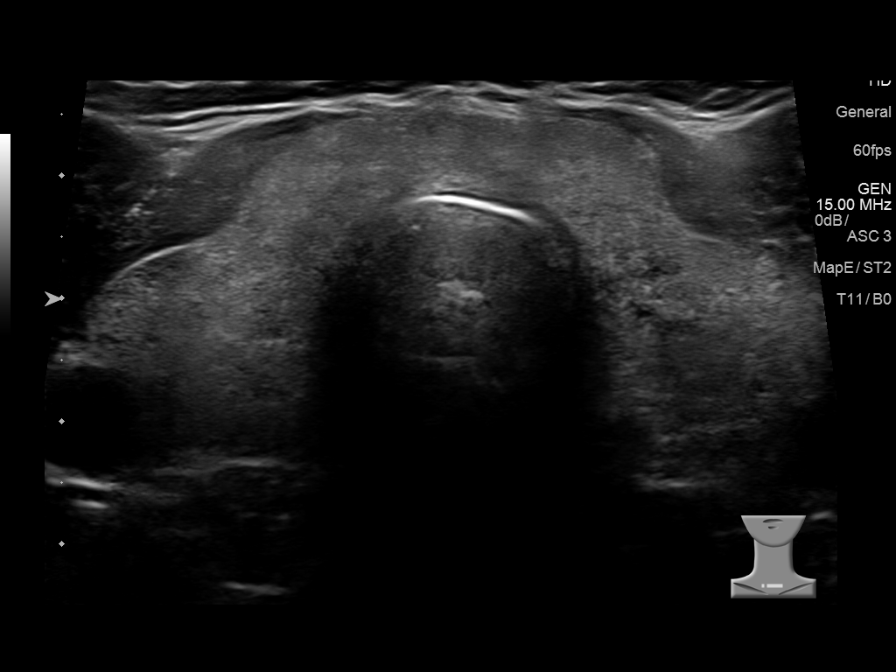
[im 7/9]
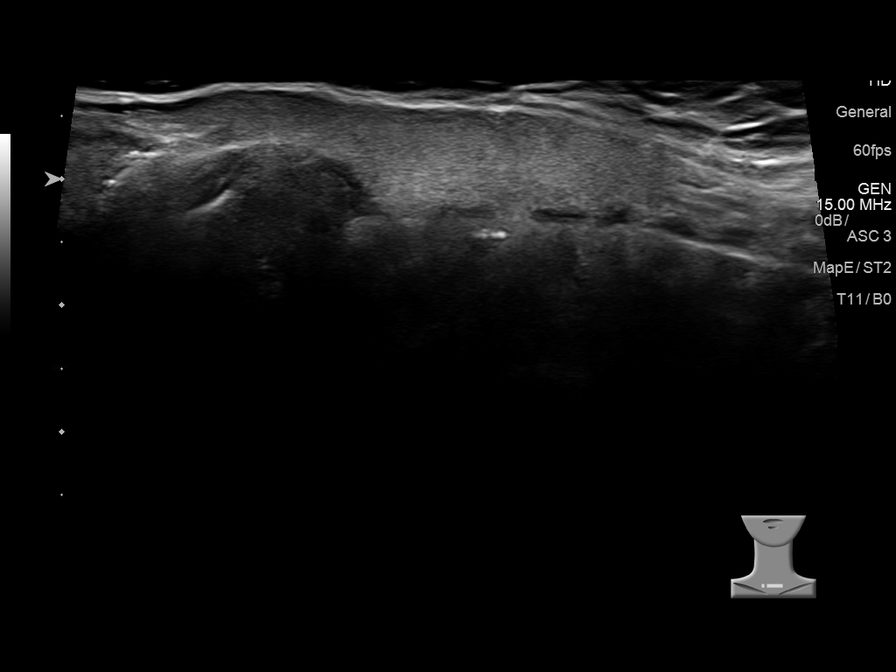
[im 8/9]
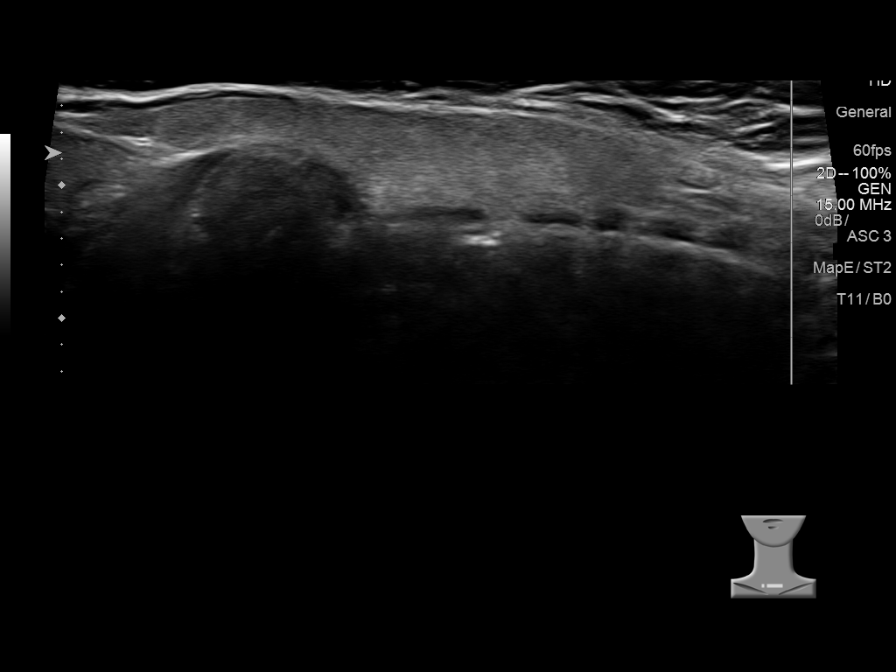
[im 9/9]
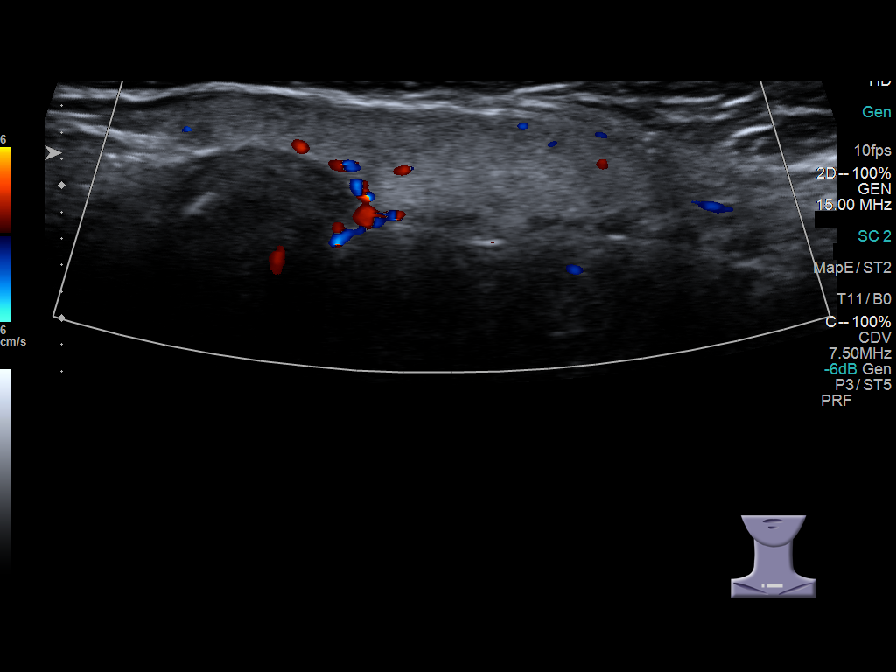

[9 of 9 positions shown; findings below may reference images not displayed]

FINDINGS: Grayscale and color duplex images demonstrate no evidence of focal
fluid or soft tissue.

Unremarkable appearing thyroid gland.
IMPRESSION: Unremarkable sonographic survey in the region of clinical concern.

## 2019-03-15 ENCOUNTER — Encounter: Payer: Managed Care, Other (non HMO) | Admitting: Physician Assistant

## 2019-03-18 ENCOUNTER — Other Ambulatory Visit: Payer: Self-pay

## 2019-03-19 ENCOUNTER — Encounter: Payer: Self-pay | Admitting: Physician Assistant

## 2019-03-19 ENCOUNTER — Ambulatory Visit (INDEPENDENT_AMBULATORY_CARE_PROVIDER_SITE_OTHER): Payer: 59 | Admitting: Physician Assistant

## 2019-03-19 VITALS — BP 132/88 | HR 66 | Temp 98.4°F | Ht 68.0 in | Wt 219.6 lb

## 2019-03-19 DIAGNOSIS — Z0001 Encounter for general adult medical examination with abnormal findings: Secondary | ICD-10-CM | POA: Diagnosis not present

## 2019-03-19 DIAGNOSIS — Z0000 Encounter for general adult medical examination without abnormal findings: Secondary | ICD-10-CM

## 2019-03-19 DIAGNOSIS — E785 Hyperlipidemia, unspecified: Secondary | ICD-10-CM

## 2019-03-19 DIAGNOSIS — I10 Essential (primary) hypertension: Secondary | ICD-10-CM

## 2019-03-19 LAB — BAYER DCA HB A1C WAIVED: HB A1C (BAYER DCA - WAIVED): 4.9 % (ref <7.0)

## 2019-03-19 NOTE — Patient Instructions (Signed)

## 2019-03-19 NOTE — Progress Notes (Signed)
.   BP 132/88   Pulse 66   Temp 98.4 F (36.9 C) (Temporal)   Ht _0  (1.727 m)   Wt 219 lb 9.6 oz (99.6 kg)   SpO2 100%   BMI 33.39 kg/m    Subjective:    Patient ID: Matthew Weber, male    DOB: December 18, 1965, 54 y.o.   MRN: [id number]  This patient comes in for his annual exam.  All of his past records, labs, medications are reviewed.  He is up-to-date currently on his medications and does not need refills until August of this year.  We will see him back in the office at that time. Hyperlipidemia This is a chronic problem. The current episode started more than 1 year ago. The problem is controlled. Recent lipid tests were reviewed and are normal. Pertinent negatives include no chest pain or shortness of breath. Current antihyperlipidemic treatment includes statins. The current treatment provides significant improvement of lipids. Risk factors for coronary artery disease include male sex.  Hypertension This is a chronic problem. The current episode started more than 1 year ago. The problem is controlled. Pertinent negatives include no chest pain, headaches, palpitations or shortness of breath. Past treatments include calcium channel blockers and ACE inhibitors. The current treatment provides significant improvement.    Past Medical History:  Diagnosis Date  . Hyperlipidemia   . Hypertension    Relevant past medical, surgical, family and social history reviewed and updated as indicated. Interim medical history since our last visit reviewed. Allergies and medications reviewed and updated. DATA REVIEWED: CHART IN EPIC  Family History reviewed for pertinent findings.  Review of Systems  Constitutional: Negative.  Negative for appetite change and fatigue.  HENT: Negative.   Eyes: Negative.  Negative for pain and visual disturbance.  Respiratory: Negative.  Negative for cough, chest tightness, shortness of breath and wheezing.   Cardiovascular: Negative.  Negative for chest pain,  palpitations and leg swelling.  Gastrointestinal: Negative.  Negative for abdominal pain, diarrhea, nausea and vomiting.  Endocrine: Negative.   Genitourinary: Negative.   Musculoskeletal: Negative.   Skin: Negative.  Negative for color change and rash.  Neurological: Negative.  Negative for weakness, numbness and headaches.  Psychiatric/Behavioral: Negative.     Allergies as of 03/19/2019   No Known Allergies     Medication List       Accurate as of March 19, 2019  8:32 AM. If you have any questions, ask your nurse or doctor.        amLODipine 10 MG tablet Commonly known as: NORVASC Take 1 tablet (10 mg total) by mouth daily.   aspirin 325 MG tablet Take 325 mg by mouth daily.   atorvastatin 10 MG tablet Commonly known as: Lipitor Take 1 tablet (10 mg total) by mouth daily.   CORICIDIN HBP COUGH/COLD PO Take by mouth.   Fish Oil 1200 MG Caps Take 1,200 mg by mouth daily.   Garlic 5643 MG Caps Take 1,000 mg by mouth 2 (two) times daily.   lisinopril 20 MG tablet Commonly known as: ZESTRIL Take 1 tablet (20 mg total) by mouth daily.   loratadine 10 MG tablet Commonly known as: Claritin Take 1 tablet (10 mg total) by mouth daily.   Magnesium 250 MG Tabs Take 250 mg by mouth 2 (two) times daily.          Objective:    BP 132/88   Pulse 66   Temp 98.4 F (36.9 C) (Temporal)   Ht  _0  (1.727 m)   Wt 219 lb 9.6 oz (99.6 kg)   SpO2 100%   BMI 33.39 kg/m   No Known Allergies  Wt Readings from Last 3 Encounters:  03/19/19 219 lb 9.6 oz (99.6 kg)  09/06/18 222 lb 9.6 oz (101 kg)  06/05/18 221 lb (100.2 kg)    Physical Exam Constitutional:      Appearance: He is well-developed.  HENT:     Head: Normocephalic and atraumatic.  Eyes:     Conjunctiva/sclera: Conjunctivae normal.     Pupils: Pupils are equal, round, and reactive to light.  Cardiovascular:     Rate and Rhythm: Normal rate and regular rhythm.     Heart sounds: Normal heart sounds.    Pulmonary:     Effort: Pulmonary effort is normal.     Breath sounds: Normal breath sounds.  Abdominal:     General: Bowel sounds are normal.     Palpations: Abdomen is soft.  Musculoskeletal:        General: Normal range of motion.     Cervical back: Normal range of motion and neck supple.  Skin:    General: Skin is warm and dry.     Results for orders placed or performed in visit on 06/05/18  Lipid panel  Result Value Ref Range   Cholesterol, Total 163 100 - 199 mg/dL   Triglycerides 113 0 - 149 mg/dL   HDL 52 >39 mg/dL   VLDL Cholesterol Cal 23 5 - 40 mg/dL   LDL Calculated 88 0 - 99 mg/dL   Chol/HDL Ratio 3.1 0.0 - 5.0 ratio  CMP14+EGFR  Result Value Ref Range   Glucose 85 65 - 99 mg/dL   BUN 17 6 - 24 mg/dL   Creatinine, Ser 1.03 0.76 - 1.27 mg/dL   GFR calc non Af Amer 82 >59 mL/min/1.73   GFR calc Af Amer 95 >59 mL/min/1.73   BUN/Creatinine Ratio 17 9 - 20   Sodium 142 134 - 144 mmol/L   Potassium 4.4 3.5 - 5.2 mmol/L   Chloride 105 96 - 106 mmol/L   CO2 21 20 - 29 mmol/L   Calcium 9.8 8.7 - 10.2 mg/dL   Total Protein 7.0 6.0 - 8.5 g/dL   Albumin 4.6 3.8 - 4.9 g/dL   Globulin, Total 2.4 1.5 - 4.5 g/dL   Albumin/Globulin Ratio 1.9 1.2 - 2.2   Bilirubin Total 1.3 (H) 0.0 - 1.2 mg/dL   Alkaline Phosphatase 82 39 - 117 IU/L   AST 39 0 - 40 IU/L   ALT 43 0 - 44 IU/L      Assessment & Plan:   1. Well adult exam - CBC with Differential/Platelet - CMP14+EGFR - Lipid panel - TSH - PSA - Bayer DCA Hb A1c Waived  2. Essential hypertension Continue amlodipine Continue Zestril  3. Hyperlipidemia, unspecified hyperlipidemia type Continue Lipitor 10 mg 1 daily  Continue all other maintenance medications as listed above.  Follow up plan: Recheck in 6 months  Educational handout given for health [place] PA-C [facility] [address]  [place], [place] [address] [phone]   03/19/2019, 8:32 AM

## 2019-03-20 LAB — CMP14+EGFR
ALT: 27 IU/L (ref 0–44)
AST: 28 IU/L (ref 0–40)
Albumin/Globulin Ratio: 1.8 (ref 1.2–2.2)
Albumin: 4.6 g/dL (ref 3.8–4.9)
Alkaline Phosphatase: 81 IU/L (ref 39–117)
BUN/Creatinine Ratio: 14 (ref 9–20)
BUN: 15 mg/dL (ref 6–24)
Bilirubin Total: 1.7 mg/dL — ABNORMAL HIGH (ref 0.0–1.2)
CO2: 20 mmol/L (ref 20–29)
Calcium: 9.7 mg/dL (ref 8.7–10.2)
Chloride: 104 mmol/L (ref 96–106)
Creatinine, Ser: 1.09 mg/dL (ref 0.76–1.27)
GFR calc Af Amer: 88 mL/min/{1.73_m2} (ref >59)
GFR calc non Af Amer: 76 mL/min/{1.73_m2} (ref >59)
Globulin, Total: 2.6 g/dL (ref 1.5–4.5)
Glucose: 80 mg/dL (ref 65–99)
Potassium: 4.4 mmol/L (ref 3.5–5.2)
Sodium: 141 mmol/L (ref 134–144)
Total Protein: 7.2 g/dL (ref 6.0–8.5)

## 2019-03-20 LAB — TSH: TSH: 1.9 u[IU]/mL (ref 0.450–4.500)

## 2019-03-20 LAB — CBC WITH DIFFERENTIAL/PLATELET
Basophils Absolute: 0.1 10*3/uL (ref 0.0–0.2)
Basos: 2 %
EOS (ABSOLUTE): 0.1 10*3/uL (ref 0.0–0.4)
Eos: 4 %
Hematocrit: 43.9 % (ref 37.5–51.0)
Hemoglobin: 15.4 g/dL (ref 13.0–17.7)
Immature Grans (Abs): 0 10*3/uL (ref 0.0–0.1)
Immature Granulocytes: 0 %
Lymphocytes Absolute: 0.9 10*3/uL (ref 0.7–3.1)
Lymphs: 28 %
MCH: 30.8 pg (ref 26.6–33.0)
MCHC: 35.1 g/dL (ref 31.5–35.7)
MCV: 88 fL (ref 79–97)
Monocytes Absolute: 0.5 10*3/uL (ref 0.1–0.9)
Monocytes: 14 %
Neutrophils Absolute: 1.7 10*3/uL (ref 1.4–7.0)
Neutrophils: 52 %
Platelets: 268 10*3/uL (ref 150–450)
RBC: 5 x10E6/uL (ref 4.14–5.80)
RDW: 13.7 % (ref 11.6–15.4)
WBC: 3.2 10*3/uL — ABNORMAL LOW (ref 3.4–10.8)

## 2019-03-20 LAB — LIPID PANEL
Chol/HDL Ratio: 2.8 ratio (ref 0.0–5.0)
Cholesterol, Total: 142 mg/dL (ref 100–199)
HDL: 50 mg/dL (ref >39)
LDL Chol Calc (NIH): 75 mg/dL (ref 0–99)
Triglycerides: 90 mg/dL (ref 0–149)
VLDL Cholesterol Cal: 17 mg/dL (ref 5–40)

## 2019-03-20 LAB — PSA: Prostate Specific Ag, Serum: 0.5 ng/mL (ref 0.0–4.0)

## 2019-12-03 ENCOUNTER — Other Ambulatory Visit: Payer: Self-pay | Admitting: *Deleted

## 2019-12-03 DIAGNOSIS — I10 Essential (primary) hypertension: Secondary | ICD-10-CM

## 2019-12-03 MED ORDER — AMLODIPINE BESYLATE 10 MG PO TABS: 10.0000 mg | ORAL_TABLET | Freq: Every day | ORAL | 0 refills | Status: DC

## 2019-12-03 NOTE — Progress Notes (Signed)
NTBS with new pcp for further refills = sent in #30 only of lisinopril

## 2019-12-03 NOTE — Progress Notes (Signed)
Attempted to contact patient - NA °

## 2020-02-03 ENCOUNTER — Other Ambulatory Visit: Payer: Self-pay | Admitting: Family Medicine

## 2020-02-03 DIAGNOSIS — I10 Essential (primary) hypertension: Secondary | ICD-10-CM

## 2020-02-06 ENCOUNTER — Other Ambulatory Visit: Payer: Self-pay | Admitting: Family Medicine

## 2020-02-06 DIAGNOSIS — I10 Essential (primary) hypertension: Secondary | ICD-10-CM

## 2020-02-07 ENCOUNTER — Other Ambulatory Visit: Payer: Self-pay | Admitting: *Deleted

## 2020-02-07 DIAGNOSIS — E785 Hyperlipidemia, unspecified: Secondary | ICD-10-CM

## 2020-02-17 ENCOUNTER — Encounter: Payer: Self-pay | Admitting: Family Medicine

## 2020-02-17 ENCOUNTER — Other Ambulatory Visit: Payer: Self-pay

## 2020-02-17 ENCOUNTER — Ambulatory Visit (INDEPENDENT_AMBULATORY_CARE_PROVIDER_SITE_OTHER): Payer: 59 | Admitting: Family Medicine

## 2020-02-17 DIAGNOSIS — E785 Hyperlipidemia, unspecified: Secondary | ICD-10-CM | POA: Diagnosis not present

## 2020-02-17 DIAGNOSIS — I10 Essential (primary) hypertension: Secondary | ICD-10-CM | POA: Diagnosis not present

## 2020-02-17 LAB — CBC WITH DIFFERENTIAL/PLATELET
Basophils Absolute: 0 10*3/uL (ref 0.0–0.2)
Basos: 0 %
EOS (ABSOLUTE): 0 10*3/uL (ref 0.0–0.4)
Eos: 1 %
Hematocrit: 47.2 % (ref 37.5–51.0)
Hemoglobin: 16.1 g/dL (ref 13.0–17.7)
Immature Grans (Abs): 0 10*3/uL (ref 0.0–0.1)
Immature Granulocytes: 0 %
Lymphocytes Absolute: 0.7 10*3/uL (ref 0.7–3.1)
Lymphs: 23 %
MCH: 29.3 pg (ref 26.6–33.0)
MCHC: 34.1 g/dL (ref 31.5–35.7)
MCV: 86 fL (ref 79–97)
Monocytes Absolute: 0.3 10*3/uL (ref 0.1–0.9)
Monocytes: 10 %
Neutrophils Absolute: 2.1 10*3/uL (ref 1.4–7.0)
Neutrophils: 66 %
Platelets: 276 10*3/uL (ref 150–450)
RBC: 5.5 x10E6/uL (ref 4.14–5.80)
RDW: 13.1 % (ref 11.6–15.4)
WBC: 3.2 10*3/uL — ABNORMAL LOW (ref 3.4–10.8)

## 2020-02-17 LAB — LIPID PANEL
Chol/HDL Ratio: 4.6 ratio (ref 0.0–5.0)
Cholesterol, Total: 236 mg/dL — ABNORMAL HIGH (ref 100–199)
HDL: 51 mg/dL (ref >39)
LDL Chol Calc (NIH): 159 mg/dL — ABNORMAL HIGH (ref 0–99)
Triglycerides: 143 mg/dL (ref 0–149)
VLDL Cholesterol Cal: 26 mg/dL (ref 5–40)

## 2020-02-17 LAB — CMP14+EGFR
ALT: 35 IU/L (ref 0–44)
AST: 31 IU/L (ref 0–40)
Albumin/Globulin Ratio: 1.7 (ref 1.2–2.2)
Albumin: 4.8 g/dL (ref 3.8–4.9)
Alkaline Phosphatase: 81 IU/L (ref 44–121)
BUN/Creatinine Ratio: 14 (ref 9–20)
BUN: 13 mg/dL (ref 6–24)
Bilirubin Total: 1.4 mg/dL — ABNORMAL HIGH (ref 0.0–1.2)
CO2: 25 mmol/L (ref 20–29)
Calcium: 10.2 mg/dL (ref 8.7–10.2)
Chloride: 102 mmol/L (ref 96–106)
Creatinine, Ser: 0.91 mg/dL (ref 0.76–1.27)
GFR calc Af Amer: 109 mL/min/{1.73_m2} (ref >59)
GFR calc non Af Amer: 94 mL/min/{1.73_m2} (ref >59)
Globulin, Total: 2.9 g/dL (ref 1.5–4.5)
Glucose: 90 mg/dL (ref 65–99)
Potassium: 4.5 mmol/L (ref 3.5–5.2)
Sodium: 140 mmol/L (ref 134–144)
Total Protein: 7.7 g/dL (ref 6.0–8.5)

## 2020-02-17 MED ORDER — ATORVASTATIN CALCIUM 10 MG PO TABS: 10.0000 mg | ORAL_TABLET | Freq: Every day | ORAL | 1 refills | Status: DC

## 2020-02-17 MED ORDER — AMLODIPINE BESYLATE 10 MG PO TABS: 10.0000 mg | ORAL_TABLET | Freq: Every day | ORAL | 1 refills | Status: DC

## 2020-02-17 NOTE — Progress Notes (Signed)
Subjective:  Patient ID: Matthew Weber, male    DOB: [birth year]  Age: 55 y.o. MRN: [id number]  CC: Hypertension   HPI Matthew Weber presents for  follow-up of hypertension. Patient has no history of headache chest pain or shortness of breath or recent cough. Patient also denies symptoms of TIA such as focal numbness or weakness.    in for follow-up of elevated cholesterol. Doing well without complaints on current medication. Denies side effects of statin including myalgia and arthralgia and nausea. Currently no chest pain, shortness of breath or other cardiovascular related symptoms noted.  History Matthew Weber has a past medical history of Hyperlipidemia and Hypertension.   He has no past surgical history on file.   His family history includes Diabetes in his mother; Heart attack in his father; Hypertension in his mother.He reports that he has never smoked. He has never used smokeless tobacco. He reports that he does not drink alcohol and does not use drugs.  Current Outpatient Medications on File Prior to Visit  Medication Sig Dispense Refill  . aspirin 325 MG tablet Take 325 mg by mouth daily.    . Chlorpheniramine-DM (CORICIDIN HBP COUGH/COLD PO) Take by mouth.    . Garlic 0102 MG CAPS Take 1,000 mg by mouth 2 (two) times daily.    Marland Kitchen loratadine (CLARITIN) 10 MG tablet Take 1 tablet (10 mg total) by mouth daily. 90 tablet 3  . Magnesium 250 MG TABS Take 250 mg by mouth 2 (two) times daily.    . Omega-3 Fatty Acids (FISH OIL) 1200 MG CAPS Take 1,200 mg by mouth daily.     No current facility-administered medications on file prior to visit.    ROS Review of Systems  Constitutional: Negative for fever.  Respiratory: Negative for shortness of breath.   Cardiovascular: Negative for chest pain.  Musculoskeletal: Negative for arthralgias.  Skin: Negative for rash.    Objective:  BP (!) 145/85   Pulse 63   Temp 97.7 F (36.5 C) (Temporal)   Ht 5' 8" (1.727 m)   Wt 215 lb 3.2 oz (97.6  kg)   BMI 32.72 kg/m   BP Readings from Last 3 Encounters:  02/17/20 (!) 145/85  03/19/19 132/88  09/06/18 135/89    Wt Readings from Last 3 Encounters:  02/17/20 215 lb 3.2 oz (97.6 kg)  03/19/19 219 lb 9.6 oz (99.6 kg)  09/06/18 222 lb 9.6 oz (101 kg)     Physical Exam Vitals reviewed.  Constitutional:      Appearance: He is well-developed and well-nourished.  HENT:     Head: Normocephalic and atraumatic.     Right Ear: Tympanic membrane and external ear normal. No decreased hearing noted.     Left Ear: Tympanic membrane and external ear normal. No decreased hearing noted.     Mouth/Throat:     Pharynx: No oropharyngeal exudate or posterior oropharyngeal erythema.  Eyes:     Pupils: Pupils are equal, round, and reactive to light.  Cardiovascular:     Rate and Rhythm: Normal rate and regular rhythm.     Heart sounds: No murmur heard.   Pulmonary:     Effort: No respiratory distress.     Breath sounds: Normal breath sounds.  Abdominal:     General: Bowel sounds are normal.     Palpations: Abdomen is soft. There is no mass.     Tenderness: There is no abdominal tenderness.  Musculoskeletal:     Cervical back: Normal range of motion  and neck supple.       Assessment & Plan:   Matthew Weber was seen today for hypertension.  Diagnoses and all orders for this visit:  Hyperlipidemia, unspecified hyperlipidemia type -     atorvastatin (LIPITOR) 10 MG tablet; Take 1 tablet (10 mg total) by mouth daily. -     CBC with Differential/Platelet -     CMP14+EGFR -     Lipid panel  Essential hypertension -     amLODipine (NORVASC) 10 MG tablet; Take 1 tablet (10 mg total) by mouth daily. -     CBC with Differential/Platelet -     CMP14+EGFR -     Lipid panel   Allergies as of 02/17/2020      Reactions   Ace Inhibitors Swelling      Medication List       Accurate as of February 17, 2020 10:40 AM. If you have any questions, ask your nurse or doctor.        STOP  taking these medications   lisinopril 20 MG tablet Commonly known as: ZESTRIL Stopped by: Claretta Fraise, MD     TAKE these medications   amLODipine 10 MG tablet Commonly known as: NORVASC Take 1 tablet (10 mg total) by mouth daily.   aspirin 325 MG tablet Take 325 mg by mouth daily.   atorvastatin 10 MG tablet Commonly known as: Lipitor Take 1 tablet (10 mg total) by mouth daily.   CORICIDIN HBP COUGH/COLD PO Take by mouth.   Fish Oil 1200 MG Caps Take 1,200 mg by mouth daily.   Garlic 2505 MG Caps Take 1,000 mg by mouth 2 (two) times daily.   loratadine 10 MG tablet Commonly known as: Claritin Take 1 tablet (10 mg total) by mouth daily.   Magnesium 250 MG Tabs Take 250 mg by mouth 2 (two) times daily.       Meds ordered this encounter  Medications  . atorvastatin (LIPITOR) 10 MG tablet    Sig: Take 1 tablet (10 mg total) by mouth daily.    Dispense:  90 tablet    Refill:  1  . amLODipine (NORVASC) 10 MG tablet    Sig: Take 1 tablet (10 mg total) by mouth daily.    Dispense:  90 tablet    Refill:  1    We reviewed the various foods that were appropriate for the DASH diet.  Handout was given.  Follow-up: Return in about 6 weeks (around 03/30/2020) for Compete physical with Matthew Weber .  Claretta Fraise, M.D.

## 2020-02-17 NOTE — Patient Instructions (Signed)

## 2020-03-02 ENCOUNTER — Emergency Department (HOSPITAL_COMMUNITY)
Admission: EM | Admit: 2020-03-02 | Discharge: 2020-03-02 | Disposition: A | Discharge status: Home / Self Care | Payer: 59 | Attending: Emergency Medicine | Admitting: Emergency Medicine

## 2020-03-02 ENCOUNTER — Emergency Department (HOSPITAL_COMMUNITY): Payer: 59

## 2020-03-02 ENCOUNTER — Other Ambulatory Visit: Payer: Self-pay

## 2020-03-02 ENCOUNTER — Encounter (HOSPITAL_COMMUNITY): Payer: Self-pay | Admitting: Emergency Medicine

## 2020-03-02 DIAGNOSIS — I10 Essential (primary) hypertension: Secondary | ICD-10-CM | POA: Insufficient documentation

## 2020-03-02 DIAGNOSIS — Z7982 Long term (current) use of aspirin: Secondary | ICD-10-CM | POA: Diagnosis not present

## 2020-03-02 DIAGNOSIS — Z79899 Other long term (current) drug therapy: Secondary | ICD-10-CM | POA: Insufficient documentation

## 2020-03-02 DIAGNOSIS — R072 Precordial pain: Secondary | ICD-10-CM | POA: Insufficient documentation

## 2020-03-02 DIAGNOSIS — R079 Chest pain, unspecified: Secondary | ICD-10-CM

## 2020-03-02 HISTORY — DX: Calculus of kidney: N20.0

## 2020-03-02 LAB — CBC
HCT: 45.3 % (ref 39.0–52.0)
Hemoglobin: 15.3 g/dL (ref 13.0–17.0)
MCH: 30.1 pg (ref 26.0–34.0)
MCHC: 33.8 g/dL (ref 30.0–36.0)
MCV: 89 fL (ref 80.0–100.0)
Platelets: 248 10*3/uL (ref 150–400)
RBC: 5.09 MIL/uL (ref 4.22–5.81)
RDW: 13.2 % (ref 11.5–15.5)
WBC: 3.5 10*3/uL — ABNORMAL LOW (ref 4.0–10.5)
nRBC: 0 % (ref 0.0–0.2)

## 2020-03-02 LAB — COMPREHENSIVE METABOLIC PANEL
ALT: 37 U/L (ref 0–44)
AST: 40 U/L (ref 15–41)
Albumin: 4.6 g/dL (ref 3.5–5.0)
Alkaline Phosphatase: 60 U/L (ref 38–126)
Anion gap: 11 (ref 5–15)
BUN: 22 mg/dL — ABNORMAL HIGH (ref 6–20)
CO2: 22 mmol/L (ref 22–32)
Calcium: 9.5 mg/dL (ref 8.9–10.3)
Chloride: 105 mmol/L (ref 98–111)
Creatinine, Ser: 0.88 mg/dL (ref 0.61–1.24)
GFR, Estimated: >60 mL/min (ref >60)
Glucose, Bld: 91 mg/dL (ref 70–99)
Potassium: 3.5 mmol/L (ref 3.5–5.1)
Sodium: 138 mmol/L (ref 135–145)
Total Bilirubin: 1.7 mg/dL — ABNORMAL HIGH (ref 0.3–1.2)
Total Protein: 7.9 g/dL (ref 6.5–8.1)

## 2020-03-02 LAB — TROPONIN I (HIGH SENSITIVITY)
Troponin I (High Sensitivity): 4 ng/L (ref <18)
Troponin I (High Sensitivity): 4 ng/L (ref <18)

## 2020-03-02 MED ORDER — ASPIRIN 81 MG PO CHEW
324.0000 mg | CHEWABLE_TABLET | Freq: Once | ORAL | Status: AC
  Administered 2020-03-02: 324 mg via ORAL
  Filled 2020-03-02: qty 4

## 2020-03-02 NOTE — ED Notes (Signed)
Pt ambulated to the bathroom without difficulty. Urine specimen collected at this time.

## 2020-03-02 NOTE — ED Notes (Signed)
X-ray at bedside

## 2020-03-02 NOTE — ED Provider Notes (Signed)
[facility] EMERGENCY DEPARTMENT Provider Note   CSN: [id number] Arrival date & time: 03/02/20  0348     History Chief Complaint  Patient presents with   Chest Pain    Matthew Weber is a 55 y.o. male.  The history is provided by the patient.  Chest Pain Pain location:  Substernal area Pain quality: aching   Pain radiates to:  L shoulder Pain severity:  Mild Duration:  3 hours Timing:  Intermittent Progression:  Resolved Chronicity:  Recurrent Worsened by:  Coughing and deep breathing      Past Medical History:  Diagnosis Date   Hyperlipidemia    Hypertension    Kidney stones     Patient Active Problem List   Diagnosis Date Noted   Hyperlipidemia 03/08/2018   Essential hypertension 08/17/2016   Allergic rhinitis due to pollen 08/17/2016    History reviewed. No pertinent surgical history.     Family History  Problem Relation Age of Onset   Diabetes Mother    Hypertension Mother    Heart attack Father     Social History   Tobacco Use   Smoking status: Never Smoker   Smokeless tobacco: Never Used  Vaping Use   Vaping Use: Never used  Substance Use Topics   Alcohol use: No   Drug use: No    Home Medications Prior to Admission medications   Medication Sig Start Date End Date Taking? Authorizing Provider  amLODipine (NORVASC) 10 MG tablet Take 1 tablet (10 mg total) by mouth daily. 02/17/20   Claretta Fraise, MD  aspirin 325 MG tablet Take 325 mg by mouth daily.    [provider]  atorvastatin (LIPITOR) 10 MG tablet Take 1 tablet (10 mg total) by mouth daily. 02/17/20   Claretta Fraise, MD  Chlorpheniramine-DM (CORICIDIN HBP COUGH/COLD PO) Take by mouth.    [provider]  Garlic 8119 MG CAPS Take 1,000 mg by mouth 2 (two) times daily.    [provider]  loratadine (CLARITIN) 10 MG tablet Take 1 tablet (10 mg total) by mouth daily. 08/17/16   Terald Sleeper, PA-C  Magnesium 250 MG TABS Take 250 mg by mouth 2  (two) times daily.    [provider]  Omega-3 Fatty Acids (FISH OIL) 1200 MG CAPS Take 1,200 mg by mouth daily.    [provider]    Allergies    Ace inhibitors  Review of Systems   Review of Systems  Cardiovascular: Positive for chest pain.  All other systems reviewed and are negative.   Physical Exam Updated Vital Signs BP 130/88    Pulse 65    Temp 98.2 F (36.8 C) (Oral)    Resp 16    Ht 5\' 8"  (1.727 m)    Wt 98 kg    SpO2 94%    BMI 32.85 kg/m   Physical Exam Vitals and nursing note reviewed.  Constitutional:      Appearance: He is well-developed and well-nourished.  HENT:     Head: Normocephalic and atraumatic.  Cardiovascular:     Rate and Rhythm: Normal rate.  Pulmonary:     Effort: Pulmonary effort is normal. No respiratory distress.  Chest:     Chest wall: No mass or tenderness.  Abdominal:     General: There is no distension.     Palpations: Abdomen is soft.  Musculoskeletal:        General: Normal range of motion.     Cervical back: Normal range  of motion.  Skin:    General: Skin is warm and dry.  Neurological:     General: No focal deficit present.     Mental Status: He is alert.     ED Results / Procedures / Treatments   Labs (all labs ordered are listed, but only abnormal results are displayed) Labs Reviewed  CBC - Abnormal; Notable for the following components:      Result Value   WBC 3.5 (*)    All other components within normal limits  COMPREHENSIVE METABOLIC PANEL - Abnormal; Notable for the following components:   BUN 22 (*)    Total Bilirubin 1.7 (*)    All other components within normal limits  CBG MONITORING, ED  TROPONIN I (HIGH SENSITIVITY)  TROPONIN I (HIGH SENSITIVITY)    EKG EKG Interpretation  Date/Time:  Monday March 02 2020 04:03:39 EST Ventricular Rate:  66 PR Interval:    QRS Duration: 106 QT Interval:  410 QTC Calculation: 430 R Axis:   20 Text Interpretation: Sinus rhythm Prolonged PR  interval Consider left atrial enlargement Baseline wander in lead(s) V4 No old tracing to compare Confirmed by Merrily Pew [phone]) on 03/02/2020 5:35:21 AM   Radiology DG Chest Portable 1 View  Result Date: 03/02/2020 CLINICAL DATA:  55 year old male with chest pain since yesterday radiating to the left shoulder. EXAM: PORTABLE CHEST 1 VIEW COMPARISON:  CT Abdomen and Pelvis 06/30/2019. FINDINGS: Portable AP upright view at 0434 hours. Normal lung volumes and mediastinal contours. Visualized tracheal air column is within normal limits. No pneumothorax. Allowing for portable technique the lungs are clear. Negative visible bowel gas pattern and osseous structures. IMPRESSION: Negative portable chest. Electronically Signed   By: Genevie Ann M.D.   On: 03/02/2020 04:49    Procedures Procedures   Medications Ordered in ED Medications  aspirin chewable tablet 324 mg (324 mg Oral Given 03/02/20 0439)    ED Course  I have reviewed the triage vital signs and the nursing notes.  Pertinent labs & imaging results that were available during my care of the patient were reviewed by me and considered in my medical decision making (see chart for details).    MDM Rules/Calculators/A&P                          Low suspicion for ACS, PE or pneumonia. Workup unreamrkable. Delta troponins unremarkable. ecg ok. Stable for dc with pcp follow up.  Final Clinical Impression(s) / ED Diagnoses Final diagnoses:  Nonspecific chest pain    Rx / DC Orders ED Discharge Orders    None       Ramon Brant, Corene Cornea, MD 03/03/20 [phone]

## 2020-03-02 NOTE — ED Triage Notes (Signed)
Pt with CP since yesterday morning along with L shoulder pain. Pt denies pain now. 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

## 2020-03-19 ENCOUNTER — Encounter: Payer: 59 | Admitting: Nurse Practitioner

## 2020-03-23 ENCOUNTER — Ambulatory Visit (INDEPENDENT_AMBULATORY_CARE_PROVIDER_SITE_OTHER): Payer: 59 | Admitting: Nurse Practitioner

## 2020-03-23 ENCOUNTER — Other Ambulatory Visit: Payer: Self-pay

## 2020-03-23 ENCOUNTER — Encounter: Payer: Self-pay | Admitting: Nurse Practitioner

## 2020-03-23 VITALS — BP 117/75 | HR 93 | Temp 97.4°F | Ht 68.0 in | Wt 212.0 lb

## 2020-03-23 DIAGNOSIS — Z0001 Encounter for general adult medical examination with abnormal findings: Secondary | ICD-10-CM

## 2020-03-23 DIAGNOSIS — K219 Gastro-esophageal reflux disease without esophagitis: Secondary | ICD-10-CM | POA: Diagnosis not present

## 2020-03-23 DIAGNOSIS — I10 Essential (primary) hypertension: Secondary | ICD-10-CM

## 2020-03-23 DIAGNOSIS — E782 Mixed hyperlipidemia: Secondary | ICD-10-CM | POA: Diagnosis not present

## 2020-03-23 DIAGNOSIS — Z0000 Encounter for general adult medical examination without abnormal findings: Secondary | ICD-10-CM | POA: Insufficient documentation

## 2020-03-23 NOTE — Assessment & Plan Note (Signed)
Hypertension well controlled on amlodipine 10 mg tablet by mouth daily.  Continue with low-salt diet and exercise as tolerated.  Printed handouts given to patient.  Follow-up in 3 months.  Labs completed CBC, CMP, lipid panel, results pending.

## 2020-03-23 NOTE — Progress Notes (Signed)
Established Patient Office Visit  Subjective:  Patient ID: Matthew Weber, male    DOB: August 29, 1965  Age: 55 y.o. MRN: [id number]  CC:  Chief Complaint  Patient presents with  . Annual Exam    HPI Matthew Weber presents for encounter for general adult medical examination.  Physical: Patient's last physical exam was 1 year ago .  Weight: Appropriate for height (BMI less greater than 27%) ;  Blood Pressure: Normal (BP less than 120/80) ; yes Medical History: Patient history reviewed ; Family history reviewed ; yes Allergies Reviewed: No change in current allergies ; yes Medications Reviewed: Medications reviewed - no changes ;  Lipids: Normal lipid levels ; labs completed Smoking: Life-long non-smoker ;  Physical Activity: Exercises at least 3 times per week ;  Alcohol/Drug Use: Is a non-drinker ; No illicit drug use ;  Patient is not afflicted from Stress Incontinence and Urge Incontinence  Safety: reviewed ; Patient wears a seat belt, has smoke detectors, practices appropriate gun safety, and wears sunscreen with extended sun exposure. Dental Care: biannual cleanings, brushes and flosses daily. Ophthalmology/Optometry: Annual visit.  Hearing loss: none Vision impairments: none  Past Medical History:  Diagnosis Date  . Hyperlipidemia   . Hypertension   . Kidney stones     History reviewed. No pertinent surgical history.  Family History  Problem Relation Age of Onset  . Diabetes Mother   . Hypertension Mother   . Heart attack Father     Social History   Socioeconomic History  . Marital status: Married    Spouse name: Not on file  . Number of children: 2  . Years of education: Not on file  . Highest education level: Not on file  Occupational History  . Not on file  Tobacco Use  . Smoking status: Never Smoker  . Smokeless tobacco: Never Used  Vaping Use  . Vaping Use: Never used  Substance and Sexual Activity  . Alcohol use: No  . Drug use: No  . Sexual  activity: Not on file  Other Topics Concern  . Not on file  Social History Narrative  . Not on file   Social Determinants of Health   Financial Resource Strain: Not on file  Food Insecurity: Not on file  Transportation Needs: Not on file  Physical Activity: Not on file  Stress: Not on file  Social Connections: Not on file  Intimate Partner Violence: Not on file    Outpatient Medications Prior to Visit  Medication Sig Dispense Refill  . amLODipine (NORVASC) 10 MG tablet Take 1 tablet (10 mg total) by mouth daily. 90 tablet 1  . aspirin 325 MG tablet Take 325 mg by mouth daily.    Marland Kitchen atorvastatin (LIPITOR) 10 MG tablet Take 1 tablet (10 mg total) by mouth daily. 90 tablet 1  . Chlorpheniramine-DM (CORICIDIN HBP COUGH/COLD PO) Take by mouth.    . Garlic 0962 MG CAPS Take 1,000 mg by mouth 2 (two) times daily.    Marland Kitchen loratadine (CLARITIN) 10 MG tablet Take 1 tablet (10 mg total) by mouth daily. 90 tablet 3  . Magnesium 250 MG TABS Take 250 mg by mouth 2 (two) times daily.    . Omega-3 Fatty Acids (FISH OIL) 1200 MG CAPS Take 1,200 mg by mouth daily.    Marland Kitchen omeprazole (PRILOSEC) 20 MG capsule Take 20 mg by mouth daily.     No facility-administered medications prior to visit.    Allergies  Allergen Reactions  . Ace  Inhibitors Swelling    ROS Review of Systems  Constitutional: Negative.   HENT: Negative for facial swelling and sore throat.   Eyes: Negative.   Respiratory: Negative.   Cardiovascular: Negative.   Genitourinary: Negative.   Musculoskeletal: Negative.   Neurological: Negative.   Psychiatric/Behavioral: Negative.   All other systems reviewed and are negative.     Objective:    Physical Exam Vitals reviewed.  Constitutional:      Appearance: Normal appearance.  HENT:     Head: Normocephalic.     Nose: Nose normal.  Eyes:     Conjunctiva/sclera: Conjunctivae normal.  Cardiovascular:     Rate and Rhythm: Normal rate and regular rhythm.     Pulses: Normal  pulses.     Heart sounds: Normal heart sounds.  Pulmonary:     Effort: Pulmonary effort is normal.     Breath sounds: Normal breath sounds.  Abdominal:     General: Bowel sounds are normal.  Musculoskeletal:        General: Normal range of motion.     Cervical back: Normal range of motion.  Skin:    General: Skin is warm.  Neurological:     Mental Status: He is alert and oriented to person, place, and time.     BP 117/75   Pulse 93   Temp (!) 97.4 F (36.3 C)   Ht 5\' 8"  (1.727 m)   Wt 212 lb (96.2 kg)   SpO2 98%   BMI 32.23 kg/m  Wt Readings from Last 3 Encounters:  03/23/20 212 lb (96.2 kg)  03/02/20 216 lb 0.8 oz (98 kg)  02/17/20 215 lb 3.2 oz (97.6 kg)     Health Maintenance Due  Topic Date Due  . Hepatitis C Screening  Never done  . HIV Screening  Never done  . COVID-19 Vaccine (3 - Booster for Moderna series) 11/03/2019    There are no preventive care reminders to display for this patient.  Lab Results  Component Value Date   TSH 1.900 03/19/2019   Lab Results  Component Value Date   WBC 3.5 (L) 03/02/2020   HGB 15.3 03/02/2020   HCT 45.3 03/02/2020   MCV 89.0 03/02/2020   PLT 248 03/02/2020   Lab Results  Component Value Date   NA 138 03/02/2020   K 3.5 03/02/2020   CO2 22 03/02/2020   GLUCOSE 91 03/02/2020   BUN 22 (H) 03/02/2020   CREATININE 0.88 03/02/2020   BILITOT 1.7 (H) 03/02/2020   ALKPHOS 60 03/02/2020   AST 40 03/02/2020   ALT 37 03/02/2020   PROT 7.9 03/02/2020   ALBUMIN 4.6 03/02/2020   CALCIUM 9.5 03/02/2020   ANIONGAP 11 03/02/2020   Lab Results  Component Value Date   CHOL 236 (H) 02/17/2020   Lab Results  Component Value Date   HDL 51 02/17/2020   Lab Results  Component Value Date   LDLCALC 159 (H) 02/17/2020   Lab Results  Component Value Date   TRIG 143 02/17/2020   Lab Results  Component Value Date   CHOLHDL 4.6 02/17/2020   Lab Results  Component Value Date   HGBA1C 4.9 03/19/2019       Assessment & Plan:   Problem List Items Addressed This Visit      Cardiovascular and Mediastinum   Essential hypertension    Hypertension well controlled on amlodipine 10 mg tablet by mouth daily.  Continue with low-salt diet and exercise as tolerated.  Printed handouts  given to patient.  Follow-up in 3 months.  Labs completed CBC, CMP, lipid panel, results pending.        Digestive   Gastroesophageal reflux disease without esophagitis    Good compliance with omeprazole 20 mg tablet by mouth daily.  Symptoms are well controlled.  Education provided with printed handouts given      Relevant Medications   omeprazole (PRILOSEC) 20 MG capsule     Other   Hyperlipidemia    Lipid panels completed results pending.  Provided education to patient low cholesterol diet, weight loss and exercise recommended.  Follow-up in 3 months      Annual physical exam - Primary    Annual physical exam completed head to toe assessment education provided on health maintenance and preventative care.  Printed handouts given to patient.  Completed labs-CBC, CMP, lipid panel, hepatitis C panel, HIV panel completed.      Relevant Orders   CBC with Differential   Comprehensive metabolic panel   Lipid Panel   Hepatic Function Panel   HIV antibody (with reflex)        Follow-up: Return in about 3 months (around 06/20/2020).    Ivy Lynn, NP

## 2020-03-23 NOTE — Assessment & Plan Note (Signed)
Annual physical exam completed head to toe assessment education provided on health maintenance and preventative care.  Printed handouts given to patient.  Completed labs-CBC, CMP, lipid panel, hepatitis C panel, HIV panel completed.

## 2020-03-23 NOTE — Assessment & Plan Note (Signed)
Lipid panels completed results pending.  Provided education to patient low cholesterol diet, weight loss and exercise recommended.  Follow-up in 3 months

## 2020-03-23 NOTE — Assessment & Plan Note (Signed)
Good compliance with omeprazole 20 mg tablet by mouth daily.  Symptoms are well controlled.  Education provided with printed handouts given

## 2020-03-23 NOTE — Patient Instructions (Signed)

## 2020-03-24 LAB — CBC WITH DIFFERENTIAL/PLATELET
Basophils Absolute: 0 10*3/uL (ref 0.0–0.2)
Basos: 0 %
EOS (ABSOLUTE): 0.1 10*3/uL (ref 0.0–0.4)
Eos: 3 %
Hematocrit: 47.1 % (ref 37.5–51.0)
Hemoglobin: 16.1 g/dL (ref 13.0–17.7)
Immature Grans (Abs): 0 10*3/uL (ref 0.0–0.1)
Immature Granulocytes: 0 %
Lymphocytes Absolute: 1 10*3/uL (ref 0.7–3.1)
Lymphs: 32 %
MCH: 29.4 pg (ref 26.6–33.0)
MCHC: 34.2 g/dL (ref 31.5–35.7)
MCV: 86 fL (ref 79–97)
Monocytes Absolute: 0.3 10*3/uL (ref 0.1–0.9)
Monocytes: 11 %
Neutrophils Absolute: 1.7 10*3/uL (ref 1.4–7.0)
Neutrophils: 54 %
Platelets: 288 10*3/uL (ref 150–450)
RBC: 5.48 x10E6/uL (ref 4.14–5.80)
RDW: 13.2 % (ref 11.6–15.4)
WBC: 3.1 10*3/uL — ABNORMAL LOW (ref 3.4–10.8)

## 2020-03-24 LAB — COMPREHENSIVE METABOLIC PANEL
ALT: 32 IU/L (ref 0–44)
AST: 28 IU/L (ref 0–40)
Albumin/Globulin Ratio: 1.7 (ref 1.2–2.2)
Albumin: 4.7 g/dL (ref 3.8–4.9)
Alkaline Phosphatase: 84 IU/L (ref 44–121)
BUN/Creatinine Ratio: 15 (ref 9–20)
BUN: 15 mg/dL (ref 6–24)
Bilirubin Total: 1.7 mg/dL — ABNORMAL HIGH (ref 0.0–1.2)
CO2: 20 mmol/L (ref 20–29)
Calcium: 10 mg/dL (ref 8.7–10.2)
Chloride: 102 mmol/L (ref 96–106)
Creatinine, Ser: 1.01 mg/dL (ref 0.76–1.27)
Globulin, Total: 2.7 g/dL (ref 1.5–4.5)
Glucose: 87 mg/dL (ref 65–99)
Potassium: 4.4 mmol/L (ref 3.5–5.2)
Sodium: 139 mmol/L (ref 134–144)
Total Protein: 7.4 g/dL (ref 6.0–8.5)
eGFR: 87 mL/min/{1.73_m2} (ref >59)

## 2020-03-24 LAB — LIPID PANEL
Chol/HDL Ratio: 2.9 ratio (ref 0.0–5.0)
Cholesterol, Total: 157 mg/dL (ref 100–199)
HDL: 54 mg/dL (ref >39)
LDL Chol Calc (NIH): 85 mg/dL (ref 0–99)
Triglycerides: 99 mg/dL (ref 0–149)
VLDL Cholesterol Cal: 18 mg/dL (ref 5–40)

## 2020-03-24 LAB — HIV ANTIBODY (ROUTINE TESTING W REFLEX): HIV Screen 4th Generation wRfx: NONREACTIVE

## 2020-03-24 LAB — HEPATIC FUNCTION PANEL: Bilirubin, Direct: 0.38 mg/dL (ref 0.00–0.40)

## 2020-06-23 ENCOUNTER — Encounter: Payer: Self-pay | Admitting: Nurse Practitioner

## 2020-06-23 ENCOUNTER — Ambulatory Visit (INDEPENDENT_AMBULATORY_CARE_PROVIDER_SITE_OTHER): Payer: 59 | Admitting: Nurse Practitioner

## 2020-06-23 ENCOUNTER — Other Ambulatory Visit: Payer: Self-pay

## 2020-06-23 VITALS — BP 129/88 | HR 59 | Temp 97.4°F | Ht 68.0 in | Wt 209.0 lb

## 2020-06-23 DIAGNOSIS — K219 Gastro-esophageal reflux disease without esophagitis: Secondary | ICD-10-CM

## 2020-06-23 DIAGNOSIS — I10 Essential (primary) hypertension: Secondary | ICD-10-CM

## 2020-06-23 DIAGNOSIS — R399 Unspecified symptoms and signs involving the genitourinary system: Secondary | ICD-10-CM | POA: Insufficient documentation

## 2020-06-23 DIAGNOSIS — E785 Hyperlipidemia, unspecified: Secondary | ICD-10-CM | POA: Diagnosis not present

## 2020-06-23 LAB — CBC WITH DIFFERENTIAL/PLATELET
Basophils Absolute: 0 10*3/uL (ref 0.0–0.2)
Basos: 1 %
EOS (ABSOLUTE): 0.1 10*3/uL (ref 0.0–0.4)
Eos: 5 %
Hematocrit: 43 % (ref 37.5–51.0)
Hemoglobin: 14.5 g/dL (ref 13.0–17.7)
Immature Grans (Abs): 0 10*3/uL (ref 0.0–0.1)
Immature Granulocytes: 0 %
Lymphocytes Absolute: 0.6 10*3/uL — ABNORMAL LOW (ref 0.7–3.1)
Lymphs: 23 %
MCH: 30.6 pg (ref 26.6–33.0)
MCHC: 33.7 g/dL (ref 31.5–35.7)
MCV: 91 fL (ref 79–97)
Monocytes Absolute: 0.4 10*3/uL (ref 0.1–0.9)
Monocytes: 14 %
Neutrophils Absolute: 1.5 10*3/uL (ref 1.4–7.0)
Neutrophils: 57 %
Platelets: 252 10*3/uL (ref 150–450)
RBC: 4.74 x10E6/uL (ref 4.14–5.80)
RDW: 13.6 % (ref 11.6–15.4)
WBC: 2.6 10*3/uL — ABNORMAL LOW (ref 3.4–10.8)

## 2020-06-23 LAB — COMPREHENSIVE METABOLIC PANEL
ALT: 38 IU/L (ref 0–44)
AST: 32 IU/L (ref 0–40)
Albumin/Globulin Ratio: 2 (ref 1.2–2.2)
Albumin: 4.7 g/dL (ref 3.8–4.9)
Alkaline Phosphatase: 80 IU/L (ref 44–121)
BUN/Creatinine Ratio: 15 (ref 9–20)
BUN: 14 mg/dL (ref 6–24)
Bilirubin Total: 1.5 mg/dL — ABNORMAL HIGH (ref 0.0–1.2)
CO2: 22 mmol/L (ref 20–29)
Calcium: 9.9 mg/dL (ref 8.7–10.2)
Chloride: 103 mmol/L (ref 96–106)
Creatinine, Ser: 0.91 mg/dL (ref 0.76–1.27)
Globulin, Total: 2.4 g/dL (ref 1.5–4.5)
Glucose: 86 mg/dL (ref 65–99)
Potassium: 4.1 mmol/L (ref 3.5–5.2)
Sodium: 142 mmol/L (ref 134–144)
Total Protein: 7.1 g/dL (ref 6.0–8.5)
eGFR: 99 mL/min/{1.73_m2} (ref >59)

## 2020-06-23 LAB — LIPID PANEL
Chol/HDL Ratio: 3.2 ratio (ref 0.0–5.0)
Cholesterol, Total: 176 mg/dL (ref 100–199)
HDL: 55 mg/dL (ref >39)
LDL Chol Calc (NIH): 102 mg/dL — ABNORMAL HIGH (ref 0–99)
Triglycerides: 106 mg/dL (ref 0–149)
VLDL Cholesterol Cal: 19 mg/dL (ref 5–40)

## 2020-06-23 LAB — URINALYSIS, ROUTINE W REFLEX MICROSCOPIC
Bilirubin, UA: NEGATIVE
Glucose, UA: NEGATIVE
Ketones, UA: NEGATIVE
Leukocytes,UA: NEGATIVE
Nitrite, UA: NEGATIVE
Protein,UA: NEGATIVE
Specific Gravity, UA: 1.02 (ref 1.005–1.030)
Urobilinogen, Ur: 0.2 mg/dL (ref 0.2–1.0)
pH, UA: 7 (ref 5.0–7.5)

## 2020-06-23 LAB — MICROSCOPIC EXAMINATION
Bacteria, UA: NONE SEEN
Epithelial Cells (non renal): NONE SEEN /hpf (ref 0–10)
WBC, UA: NONE SEEN /hpf (ref 0–5)

## 2020-06-23 MED ORDER — AMLODIPINE BESYLATE 10 MG PO TABS
10.0000 mg | ORAL_TABLET | Freq: Every day | ORAL | 1 refills | Status: DC
Start: 2020-06-23 — End: 2020-09-23

## 2020-06-23 MED ORDER — ATORVASTATIN CALCIUM 10 MG PO TABS: 10.0000 mg | ORAL_TABLET | Freq: Every day | ORAL | 1 refills | Status: DC

## 2020-06-23 NOTE — Patient Instructions (Signed)
Symptoms well controlled , no changes to all current medication. Complete labs today-CBC,CMP, LIPID Panel. Follow up in 3 months.   [employer]'s Current Therapy 2021 (pp. 213-216). [place], [place]: Else[employer].">  Gastroesophageal Reflux Disease, Adult Gastroesophageal reflux (GER) happens when acid from the stomach flows up into the tube that connects the mouth and the stomach (esophagus). Normally, food travels down the esophagus and stays in the stomach to be digested. However, when a person has GER, food and stomach acid sometimes move back up into the esophagus. If this becomes a more serious problem, the person may be diagnosed with a disease called gastroesophageal reflux disease (GERD). GERD occurs when the reflux:  Happens often.  Causes frequent or severe symptoms.  Causes problems such as damage to the esophagus. When stomach acid comes in contact with the esophagus, the acid may cause inflammation in the esophagus. Over time, GERD may create small holes (ulcers) in the lining of the esophagus. What are the causes? This condition is caused by a problem with the muscle between the esophagus and the stomach (lower esophageal sphincter, or LES). Normally, the LES muscle closes after food passes through the esophagus to the stomach. When the LES is weakened or abnormal, it does not close properly, and that allows food and stomach acid to go back up into the esophagus. The LES can be weakened by certain dietary substances, medicines, and medical conditions, including:  Tobacco use.  Pregnancy.  Having a hiatal hernia.  Alcohol use.  Certain foods and beverages, such as coffee, chocolate, onions, and peppermint. What increases the risk? You are more likely to develop this condition if you:  Have an increased body weight.  Have a connective tissue disorder.  Take NSAIDs, such as ibuprofen. What are the signs or symptoms? Symptoms of this condition include:  Heartburn.  Difficult or  painful swallowing and the feeling of having a lump in the throat.  A bitter taste in the mouth.  Bad breath and having a large amount of saliva.  Having an upset or bloated stomach and belching.  Chest pain. Different conditions can cause chest pain. Make sure you see your health care provider if you experience chest pain.  Shortness of breath or wheezing.  Ongoing (chronic) cough or a nighttime cough.  Wearing away of tooth enamel.  Weight loss. How is this diagnosed? This condition may be diagnosed based on a medical history and a physical exam. To determine if you have mild or severe GERD, your health care provider may also monitor how you respond to treatment. You may also have tests, including:  A test to examine your stomach and esophagus with a small camera (endoscopy).  A test that measures the acidity level in your esophagus.  A test that measures how much pressure is on your esophagus.  A barium swallow or modified barium swallow test to show the shape, size, and functioning of your esophagus. How is this treated? Treatment for this condition may vary depending on how severe your symptoms are. Your health care provider may recommend:  Changes to your diet.  Medicine.  Surgery. The goal of treatment is to help relieve your symptoms and to prevent complications. Follow these instructions at home: Eating and drinking  Follow a diet as recommended by your health care provider. This may involve avoiding foods and drinks such as: ? Coffee and tea, with or without caffeine. ? Drinks that contain alcohol. ? Energy drinks and sports drinks. ? Carbonated drinks or sodas. ? Chocolate  and cocoa. ? Peppermint and mint flavorings. ? Garlic and onions. ? Horseradish. ? Spicy and acidic foods, including peppers, chili powder, curry powder, vinegar, hot sauces, and barbecue sauce. ? Citrus fruit juices and citrus fruits, such as oranges, lemons, and limes. ? Tomato-based  foods, such as red sauce, chili, salsa, and pizza with red sauce. ? Fried and fatty foods, such as donuts, french fries, potato chips, and high-fat dressings. ? High-fat meats, such as hot dogs and fatty cuts of red and white meats, such as rib eye steak, sausage, ham, and bacon. ? High-fat dairy items, such as whole milk, butter, and cream cheese.  Eat small, frequent meals instead of large meals.  Avoid drinking large amounts of liquid with your meals.  Avoid eating meals during the 2-3 hours before bedtime.  Avoid lying down right after you eat.  Do not exercise right after you eat.   Lifestyle  Do not use any products that contain nicotine or tobacco. These products include cigarettes, chewing tobacco, and vaping devices, such as e-cigarettes. If you need help quitting, ask your health care provider.  Try to reduce your stress by using methods such as yoga or meditation. If you need help reducing stress, ask your health care provider.  If you are overweight, reduce your weight to an amount that is healthy for you. Ask your health care provider for guidance about a safe weight loss goal.   General instructions  Pay attention to any changes in your symptoms.  Take over-the-counter and prescription medicines only as told by your health care provider. Do not take aspirin, ibuprofen, or other NSAIDs unless your health care provider told you to take these medicines.  Wear loose-fitting clothing. Do not wear anything tight around your waist that causes pressure on your abdomen.  Raise (elevate) the head of your bed about 6 inches (15 cm). You can use a wedge to do this.  Avoid bending over if this makes your symptoms worse.  Keep all follow-up visits. This is important. Contact a health care provider if:  You have: ? New symptoms. ? Unexplained weight loss. ? Difficulty swallowing or it hurts to swallow. ? Wheezing or a persistent cough. ? A hoarse voice.  Your symptoms do not  improve with treatment. Get help right away if:  You have sudden pain in your arms, neck, jaw, teeth, or back.  You suddenly feel sweaty, dizzy, or light-headed.  You have chest pain or shortness of breath.  You vomit and the vomit is green, yellow, or black, or it looks like blood or coffee grounds.  You faint.  You have stool that is red, bloody, or black.  You cannot swallow, drink, or eat. These symptoms may represent a serious problem that is an emergency. Do not wait to see if the symptoms will go away. Get medical help right away. Call your local emergency services (911 in the [place] Do not drive yourself to the hospital. Summary  Gastroesophageal reflux happens when acid from the stomach flows up into the esophagus. GERD is a disease in which the reflux happens often, causes frequent or severe symptoms, or causes problems such as damage to the esophagus.  Treatment for this condition may vary depending on how severe your symptoms are. Your health care provider may recommend diet and lifestyle changes, medicine, or surgery.  Contact a health care provider if you have new or worsening symptoms.  Take over-the-counter and prescription medicines only as told by your health care provider.  Do not take aspirin, ibuprofen, or other NSAIDs unless your health care provider told you to do so.  Keep all follow-up visits as told by your health care provider. This is important. This information is not intended to replace advice given to you by your health care provider. Make sure you discuss any questions you have with your health care provider. Document Revised: 07/22/2019 Document Reviewed: 07/22/2019 Elsevier Patient Education  2021 [place]. High Cholesterol  High cholesterol is a condition in which the blood has high levels of a white, waxy substance similar to fat (cholesterol). The liver makes all the cholesterol that the body needs. The human body needs small amounts of  cholesterol to help build cells. A person gets extra or excess cholesterol from the food that he or she eats. The blood carries cholesterol from the liver to the rest of the body. If you have high cholesterol, deposits (plaques) may build up on the walls of your arteries. Arteries are the blood vessels that carry blood away from your heart. These plaques make the arteries narrow and stiff. Cholesterol plaques increase your risk for heart attack and stroke. Work with your health care provider to keep your cholesterol levels in a healthy range. What increases the risk? The following factors may make you more likely to develop this condition:  Eating foods that are high in animal fat (saturated fat) or cholesterol.  Being overweight.  Not getting enough exercise.  A family history of high cholesterol (familial hypercholesterolemia).  Use of tobacco products.  Having diabetes. What are the signs or symptoms? There are no symptoms of this condition. How is this diagnosed? This condition may be diagnosed based on the results of a blood test.  If you are older than 56 years of age, your health care provider may check your cholesterol levels every 4-6 years.  You may be checked more often if you have high cholesterol or other risk factors for heart disease. The blood test for cholesterol measures:  "Bad" cholesterol, or LDL cholesterol. This is the main type of cholesterol that causes heart disease. The desired level is less than 100 mg/dL.  "Good" cholesterol, or HDL cholesterol. HDL helps protect against heart disease by cleaning the arteries and carrying the LDL to the liver for processing. The desired level for HDL is 60 mg/dL or higher.  Triglycerides. These are fats that your body can store or burn for energy. The desired level is less than 150 mg/dL.  Total cholesterol. This measures the total amount of cholesterol in your blood and includes LDL, HDL, and triglycerides. The desired  level is less than 200 mg/dL. How is this treated? This condition may be treated with:  Diet changes. You may be asked to eat foods that have more fiber and less saturated fats or added sugar.  Lifestyle changes. These may include regular exercise, maintaining a healthy weight, and quitting use of tobacco products.  Medicines. These are given when diet and lifestyle changes have not worked. You may be prescribed a statin medicine to help lower your cholesterol levels. Follow these instructions at home: Eating and drinking  Eat a healthy, balanced diet. This diet includes: ? Daily servings of a variety of fresh, frozen, or canned fruits and vegetables. ? Daily servings of whole grain foods that are rich in fiber. ? Foods that are low in saturated fats and trans fats. These include poultry and fish without skin, lean cuts of meat, and low-fat dairy products. ? A variety of  fish, especially oily fish that contain omega-3 fatty acids. Aim to eat fish at least 2 times a week.  Avoid foods and drinks that have added sugar.  Use healthy cooking methods, such as roasting, grilling, broiling, baking, poaching, steaming, and stir-frying. Do not fry your food except for stir-frying.   Lifestyle  Get regular exercise. Aim to exercise for a total of 150 minutes a week. Increase your activity level by doing activities such as gardening, walking, and taking the stairs.  Do not use any products that contain nicotine or tobacco, such as cigarettes, e-cigarettes, and chewing tobacco. If you need help quitting, ask your health care provider.   General instructions  Take over-the-counter and prescription medicines only as told by your health care provider.  Keep all follow-up visits as told by your health care provider. This is important. Where to find more information  American Heart Association: www.heart.org  National Heart, Lung, and Blood Institute: https://wilson-eaton.com/ Contact a health care  provider if:  You have trouble achieving or maintaining a healthy diet or weight.  You are starting an exercise program.  You are unable to stop smoking. Get help right away if:  You have chest pain.  You have trouble breathing.  You have any symptoms of a stroke. "BE FAST" is an easy way to remember the main warning signs of a stroke: ? B - Balance. Signs are dizziness, sudden trouble walking, or loss of balance. ? E - Eyes. Signs are trouble seeing or a sudden change in vision. ? F - Face. Signs are sudden weakness or numbness of the face, or the face or eyelid drooping on one side. ? A - Arms. Signs are weakness or numbness in an arm. This happens suddenly and usually on one side of the body. ? S - Speech. Signs are sudden trouble speaking, slurred speech, or trouble understanding what people say. ? T - Time. Time to call emergency services. Write down what time symptoms started.  You have other signs of a stroke, such as: ? A sudden, severe headache with no known cause. ? Nausea or vomiting. ? Seizure. These symptoms may represent a serious problem that is an emergency. Do not wait to see if the symptoms will go away. Get medical help right away. Call your local emergency services (911 in the [place] Do not drive yourself to the hospital. Summary  Cholesterol plaques increase your risk for heart attack and stroke. Work with your health care provider to keep your cholesterol levels in a healthy range.  Eat a healthy, balanced diet, get regular exercise, and maintain a healthy weight.  Do not use any products that contain nicotine or tobacco, such as cigarettes, e-cigarettes, and chewing tobacco.  Get help right away if you have any symptoms of a stroke. This information is not intended to replace advice given to you by your health care provider. Make sure you discuss any questions you have with your health care provider. Document Revised: 12/10/2018 Document Reviewed:  12/10/2018 Elsevier Patient Education  2021 [place]. Hypertension, Adult Hypertension is another name for high blood pressure. High blood pressure forces your heart to work harder to pump blood. This can cause problems over time. There are two numbers in a blood pressure reading. There is a top number (systolic) over a bottom number (diastolic). It is best to have a blood pressure that is below 120/80. Healthy choices can help lower your blood pressure, or you may need medicine to help lower it. What  are the causes? The cause of this condition is not known. Some conditions may be related to high blood pressure. What increases the risk?  Smoking.  Having type 2 diabetes mellitus, high cholesterol, or both.  Not getting enough exercise or physical activity.  Being overweight.  Having too much fat, sugar, calories, or salt (sodium) in your diet.  Drinking too much alcohol.  Having long-term (chronic) kidney disease.  Having a family history of high blood pressure.  Age. Risk increases with age.  Race. You may be at higher risk if you are African American.  Gender. Men are at higher risk than women before age 72. After age 78, women are at higher risk than men.  Having obstructive sleep apnea.  Stress. What are the signs or symptoms?  High blood pressure may not cause symptoms. Very high blood pressure (hypertensive crisis) may cause: ? Headache. ? Feelings of worry or nervousness (anxiety). ? Shortness of breath. ? Nosebleed. ? A feeling of being sick to your stomach (nausea). ? Throwing up (vomiting). ? Changes in how you see. ? Very bad chest pain. ? Seizures. How is this treated?  This condition is treated by making healthy lifestyle changes, such as: ? Eating healthy foods. ? Exercising more. ? Drinking less alcohol.  Your health care provider may prescribe medicine if lifestyle changes are not enough to get your blood pressure under control, and  if: ? Your top number is above 130. ? Your bottom number is above 80.  Your personal target blood pressure may vary. Follow these instructions at home: Eating and drinking  If told, follow the DASH eating plan. To follow this plan: ? Fill one half of your plate at each meal with fruits and vegetables. ? Fill one fourth of your plate at each meal with whole grains. Whole grains include whole-wheat pasta, brown rice, and whole-grain bread. ? Eat or drink low-fat dairy products, such as skim milk or low-fat yogurt. ? Fill one fourth of your plate at each meal with low-fat (lean) proteins. Low-fat proteins include fish, chicken without skin, eggs, beans, and tofu. ? Avoid fatty meat, cured and processed meat, or chicken with skin. ? Avoid pre-made or processed food.  Eat less than 1,500 mg of salt each day.  Do not drink alcohol if: ? Your doctor tells you not to drink. ? You are pregnant, may be pregnant, or are planning to become pregnant.  If you drink alcohol: ? Limit how much you use to:  0-1 drink a day for women.  0-2 drinks a day for men. ? Be aware of how much alcohol is in your drink. In the [place] one drink equals one 12 oz bottle of beer (355 mL), one 5 oz glass of wine (148 mL), or one 1 oz glass of hard liquor (44 mL).   Lifestyle  Work with your doctor to stay at a healthy weight or to lose weight. Ask your doctor what the best weight is for you.  Get at least 30 minutes of exercise most days of the week. This may include walking, swimming, or biking.  Get at least 30 minutes of exercise that strengthens your muscles (resistance exercise) at least 3 days a week. This may include lifting weights or doing Pilates.  Do not use any products that contain nicotine or tobacco, such as cigarettes, e-cigarettes, and chewing tobacco. If you need help quitting, ask your doctor.  Check your blood pressure at home as told by your doctor.  Keep all follow-up visits as told by  your doctor. This is important.   Medicines  Take over-the-counter and prescription medicines only as told by your doctor. Follow directions carefully.  Do not skip doses of blood pressure medicine. The medicine does not work as well if you skip doses. Skipping doses also puts you at risk for problems.  Ask your doctor about side effects or reactions to medicines that you should watch for. Contact a doctor if you:  Think you are having a reaction to the medicine you are taking.  Have headaches that keep coming back (recurring).  Feel dizzy.  Have swelling in your ankles.  Have trouble with your vision. Get help right away if you:  Get a very bad headache.  Start to feel mixed up (confused).  Feel weak or numb.  Feel faint.  Have very bad pain in your: ? Chest. ? Belly (abdomen).  Throw up more than once.  Have trouble breathing. Summary  Hypertension is another name for high blood pressure.  High blood pressure forces your heart to work harder to pump blood.  For most people, a normal blood pressure is less than 120/80.  Making healthy choices can help lower blood pressure. If your blood pressure does not get lower with healthy choices, you may need to take medicine. This information is not intended to replace advice given to you by your health care provider. Make sure you discuss any questions you have with your health care provider. Document Revised: 09/20/2017 Document Reviewed: 09/20/2017 Elsevier Patient Education  2021 [employer].

## 2020-06-23 NOTE — Assessment & Plan Note (Signed)
No new signs and symptoms of hyperlipidemia.  Completed lipid panel results pending.  Education provided to patient with printed handouts given.  Follow-up in 3 months

## 2020-06-23 NOTE — Assessment & Plan Note (Signed)
Symptoms of GERD well-controlled on Prilosec 20 mg tablet by mouth daily. Continue healthy diet and exercise regimen as tolerated. Follow-up in 3 months.

## 2020-06-23 NOTE — Progress Notes (Signed)
Established Patient Office Visit  Subjective:  Patient ID: Matthew Weber, male    DOB: Nov 18, 1965  Age: 55 y.o. MRN: [id number]  CC:  Chief Complaint  Patient presents with  . Chronic disease management    HPI Matthew Weber presents for Pt presents for follow up of hypertension. Patient was diagnosed in 2018. The patient is tolerating the medication well without side effects. Compliance with treatment has been good; including taking medication as directed , maintains a healthy diet and regular exercise regimen , and following up as directed. Current medication Norvasc 10 mg   Hyperlipidemia: Patient presents with hyperlipidemia.  He was tested because hyperlipidemia.  His last labs showed Total cholesterol of 157, HDL 54 LDL 85  Triglycerides 99. negative. There is not a family history of hyperlipidemia. There is a family history of early ischemia heart disease.   GERD, Follow up:  The patient was last seen for GERD 3 months ago. Changes made since that visit include no changes.  He reports good compliance with treatment. He is not having side effects. Marland Kitchen  He IS experiencing mild  heartburn. He is NOT experiencing belching and eructation, choking on food, cough, deep pressure at base of neck, difficulty swallowing or dysphagia     Past Medical History:  Diagnosis Date  . Hyperlipidemia   . Hypertension   . Kidney stones       Family History  Problem Relation Age of Onset  . Diabetes Mother   . Hypertension Mother   . Heart attack Father     Social History   Socioeconomic History  . Marital status: Married    Spouse name: Not on file  . Number of children: 2  . Years of education: Not on file  . Highest education level: Not on file  Occupational History  . Not on file  Tobacco Use  . Smoking status: Never Smoker  . Smokeless tobacco: Never Used  Vaping Use  . Vaping Use: Never used  Substance and Sexual Activity  . Alcohol use: No  . Drug use: No  . Sexual  activity: Not on file  Other Topics Concern  . Not on file  Social History Narrative  . Not on file   Social Determinants of Health   Financial Resource Strain: Not on file  Food Insecurity: Not on file  Transportation Needs: Not on file  Physical Activity: Not on file  Stress: Not on file  Social Connections: Not on file  Intimate Partner Violence: Not on file    Outpatient Medications Prior to Visit  Medication Sig Dispense Refill  . amLODipine (NORVASC) 10 MG tablet Take 1 tablet (10 mg total) by mouth daily. 90 tablet 1  . aspirin 325 MG tablet Take 325 mg by mouth daily.    Marland Kitchen atorvastatin (LIPITOR) 10 MG tablet Take 1 tablet (10 mg total) by mouth daily. 90 tablet 1  . Chlorpheniramine-DM (CORICIDIN HBP COUGH/COLD PO) Take by mouth.    . Garlic 3419 MG CAPS Take 1,000 mg by mouth 2 (two) times daily.    Marland Kitchen loratadine (CLARITIN) 10 MG tablet Take 1 tablet (10 mg total) by mouth daily. 90 tablet 3  . Magnesium 250 MG TABS Take 250 mg by mouth 2 (two) times daily.    . Omega-3 Fatty Acids (FISH OIL) 1200 MG CAPS Take 1,200 mg by mouth daily.    Marland Kitchen omeprazole (PRILOSEC) 20 MG capsule Take 20 mg by mouth daily.     No facility-administered medications  prior to visit.    Allergies  Allergen Reactions  . Ace Inhibitors Swelling    ROS Review of Systems  Constitutional: Negative.   HENT: Negative.   Respiratory: Negative.   Gastrointestinal: Negative.   Genitourinary: Negative.   Musculoskeletal: Negative.   All other systems reviewed and are negative.     Objective:    Physical Exam Vitals reviewed.  Constitutional:      Appearance: Normal appearance.  HENT:     Head: Normocephalic.     Nose: Nose normal.     Mouth/Throat:     Mouth: Mucous membranes are moist.     Pharynx: Oropharynx is clear.  Eyes:     Conjunctiva/sclera: Conjunctivae normal.  Cardiovascular:     Rate and Rhythm: Normal rate and regular rhythm.     Pulses: Normal pulses.     Heart  sounds: Normal heart sounds.  Pulmonary:     Effort: Pulmonary effort is normal.     Breath sounds: Normal breath sounds.  Abdominal:     General: Bowel sounds are normal.  Skin:    General: Skin is warm.     Findings: No erythema or rash.  Neurological:     Mental Status: He is alert and oriented to person, place, and time.  Psychiatric:        Behavior: Behavior normal.     BP 129/88   Pulse (!) 59   Temp (!) 97.4 F (36.3 C) (Temporal)   Ht _0  (1.727 m)   Wt 209 lb (94.8 kg)   SpO2 98%   BMI 31.78 kg/m  Wt Readings from Last 3 Encounters:  06/23/20 209 lb (94.8 kg)  03/23/20 212 lb (96.2 kg)  03/02/20 216 lb 0.8 oz (98 kg)     Health Maintenance Due  Topic Date Due  . Hepatitis C Screening  Never done  . Zoster Vaccines- Shingrix (1 of 2) Never done  . COVID-19 Vaccine (3 - Booster for Moderna series) 10/04/2019    There are no preventive care reminders to display for this patient.  Lab Results  Component Value Date   TSH 1.900 03/19/2019   Lab Results  Component Value Date   WBC 3.1 (L) 03/23/2020   HGB 16.1 03/23/2020   HCT 47.1 03/23/2020   MCV 86 03/23/2020   PLT 288 03/23/2020   Lab Results  Component Value Date   NA 139 03/23/2020   K 4.4 03/23/2020   CO2 20 03/23/2020   GLUCOSE 87 03/23/2020   BUN 15 03/23/2020   CREATININE 1.01 03/23/2020   BILITOT 1.7 (H) 03/23/2020   ALKPHOS 84 03/23/2020   AST 28 03/23/2020   ALT 32 03/23/2020   PROT 7.4 03/23/2020   ALBUMIN 4.7 03/23/2020   CALCIUM 10.0 03/23/2020   ANIONGAP 11 03/02/2020   EGFR 87 03/23/2020   Lab Results  Component Value Date   CHOL 157 03/23/2020   Lab Results  Component Value Date   HDL 54 03/23/2020   Lab Results  Component Value Date   LDLCALC 85 03/23/2020   Lab Results  Component Value Date   TRIG 99 03/23/2020   Lab Results  Component Value Date   CHOLHDL 2.9 03/23/2020   Lab Results  Component Value Date   HGBA1C 4.9 03/19/2019      Assessment  & Plan:   Problem List Items Addressed This Visit      Cardiovascular and Mediastinum   Essential hypertension - Primary    Well controlled  on current medication , no changes to dose. Continue healthy low sodium diet and exercise. Education provided with printed hand out given. Follow up in 3 month. Refill sent to pharmacy      Relevant Orders   CBC with Differential   Comprehensive metabolic panel     Digestive   Gastroesophageal reflux disease without esophagitis    Symptoms of GERD well-controlled on Prilosec 20 mg tablet by mouth daily. Continue healthy diet and exercise regimen as tolerated. Follow-up in 3 months.        Other   Hyperlipidemia    No new signs and symptoms of hyperlipidemia.  Completed lipid panel results pending.  Education provided to patient with printed handouts given.  Follow-up in 3 months      Relevant Orders   Lipid Panel   UTI symptoms    New symptoms of UTI.  With hesitation and slow urinary stream.  Completed urinalysis results pending.  No other signs or symptoms.  Advised patient to continue to monitor changes with urinary stream.  Patient reports no pelvic pain or pressure, no fever nausea or vomiting.      Relevant Orders   Urinalysis, Routine w reflex microscopic      No orders of the defined types were placed in this encounter.   Follow-up: Return in about 3 months (around 09/23/2020).    Ivy Lynn, NP

## 2020-06-23 NOTE — Assessment & Plan Note (Signed)
Well controlled on current medication , no changes to dose. Continue healthy low sodium diet and exercise. Education provided with printed hand out given. Follow up in 3 month. Refill sent to pharmacy

## 2020-06-23 NOTE — Assessment & Plan Note (Signed)
New symptoms of UTI.  With hesitation and slow urinary stream.  Completed urinalysis results pending.  No other signs or symptoms.  Advised patient to continue to monitor changes with urinary stream.  Patient reports no pelvic pain or pressure, no fever nausea or vomiting.

## 2020-09-23 ENCOUNTER — Encounter: Payer: Self-pay | Admitting: Nurse Practitioner

## 2020-09-23 ENCOUNTER — Ambulatory Visit (INDEPENDENT_AMBULATORY_CARE_PROVIDER_SITE_OTHER): Payer: 59 | Admitting: Nurse Practitioner

## 2020-09-23 ENCOUNTER — Other Ambulatory Visit: Payer: Self-pay

## 2020-09-23 VITALS — BP 126/79 | HR 53 | Temp 97.8°F | Ht 68.0 in | Wt 212.0 lb

## 2020-09-23 DIAGNOSIS — K219 Gastro-esophageal reflux disease without esophagitis: Secondary | ICD-10-CM

## 2020-09-23 DIAGNOSIS — E785 Hyperlipidemia, unspecified: Secondary | ICD-10-CM

## 2020-09-23 DIAGNOSIS — I10 Essential (primary) hypertension: Secondary | ICD-10-CM

## 2020-09-23 LAB — CBC WITH DIFFERENTIAL/PLATELET
Basophils Absolute: 0 10*3/uL (ref 0.0–0.2)
Basos: 1 %
EOS (ABSOLUTE): 0.2 10*3/uL (ref 0.0–0.4)
Eos: 5 %
Hematocrit: 42.8 % (ref 37.5–51.0)
Hemoglobin: 14.5 g/dL (ref 13.0–17.7)
Immature Grans (Abs): 0 10*3/uL (ref 0.0–0.1)
Immature Granulocytes: 0 %
Lymphocytes Absolute: 0.9 10*3/uL (ref 0.7–3.1)
Lymphs: 30 %
MCH: 29.8 pg (ref 26.6–33.0)
MCHC: 33.9 g/dL (ref 31.5–35.7)
MCV: 88 fL (ref 79–97)
Monocytes Absolute: 0.4 10*3/uL (ref 0.1–0.9)
Monocytes: 13 %
Neutrophils Absolute: 1.6 10*3/uL (ref 1.4–7.0)
Neutrophils: 51 %
Platelets: 262 10*3/uL (ref 150–450)
RBC: 4.87 x10E6/uL (ref 4.14–5.80)
RDW: 13.5 % (ref 11.6–15.4)
WBC: 3.1 10*3/uL — ABNORMAL LOW (ref 3.4–10.8)

## 2020-09-23 LAB — COMPREHENSIVE METABOLIC PANEL
ALT: 20 IU/L (ref 0–44)
AST: 23 IU/L (ref 0–40)
Albumin/Globulin Ratio: 2 (ref 1.2–2.2)
Albumin: 4.8 g/dL (ref 3.8–4.9)
Alkaline Phosphatase: 77 IU/L (ref 44–121)
BUN/Creatinine Ratio: 14 (ref 9–20)
BUN: 13 mg/dL (ref 6–24)
Bilirubin Total: 2.2 mg/dL — ABNORMAL HIGH (ref 0.0–1.2)
CO2: 24 mmol/L (ref 20–29)
Calcium: 10 mg/dL (ref 8.7–10.2)
Chloride: 102 mmol/L (ref 96–106)
Creatinine, Ser: 0.96 mg/dL (ref 0.76–1.27)
Globulin, Total: 2.4 g/dL (ref 1.5–4.5)
Glucose: 81 mg/dL (ref 65–99)
Potassium: 4 mmol/L (ref 3.5–5.2)
Sodium: 140 mmol/L (ref 134–144)
Total Protein: 7.2 g/dL (ref 6.0–8.5)
eGFR: 93 mL/min/{1.73_m2} (ref >59)

## 2020-09-23 LAB — LIPID PANEL
Chol/HDL Ratio: 3.1 ratio (ref 0.0–5.0)
Cholesterol, Total: 157 mg/dL (ref 100–199)
HDL: 51 mg/dL (ref >39)
LDL Chol Calc (NIH): 87 mg/dL (ref 0–99)
Triglycerides: 106 mg/dL (ref 0–149)
VLDL Cholesterol Cal: 19 mg/dL (ref 5–40)

## 2020-09-23 MED ORDER — ATORVASTATIN CALCIUM 10 MG PO TABS: 10.0000 mg | ORAL_TABLET | Freq: Every day | ORAL | 1 refills | Status: DC

## 2020-09-23 MED ORDER — AMLODIPINE BESYLATE 10 MG PO TABS: 10.0000 mg | ORAL_TABLET | Freq: Every day | ORAL | 1 refills | Status: DC

## 2020-09-23 NOTE — Assessment & Plan Note (Addendum)
The pathophysiology of reflux is discussed.  Anti-reflux measures such as raising the head of the bed, avoiding tight clothing or belts, avoiding eating late at night and not lying down shortly after mealtime and achieving weight loss are discussed. Avoid ASA, NSAID's, caffeine, peppermints, alcohol and tobacco. OTC H2 blockers and/or antacids are often very helpful for PRN use. However, for persisting chronic or daily symptoms, prescription strength H2 blockers or a trial of PPI's are often used. Further recommendations to him: He should alert me if there are persistent symptoms, dysphagia, weight loss or GI bleeding. FUV is scheduled. For 6 months, patient is only using over-the-counter Tums for now.

## 2020-09-23 NOTE — Patient Instructions (Signed)
Cholesterol Content in Foods Cholesterol is a waxy, fat-like substance that helps to carry fat in the blood. The body needs cholesterol in small amounts, but too much cholesterol can cause damage to the arteries and heart. Most people should eat less than 200 milligrams (mg) of cholesterol a day. Foods with cholesterol Cholesterol is found in animal-based foods, such as meat, seafood, and dairy. Generally, low-fat dairy and lean meats have less cholesterol than full-fat dairy and fatty meats. The milligrams of cholesterol per serving (mg per serving) of common cholesterol-containing foods are listed below. Meat and other proteins Egg -- one large whole egg has 186 mg. Veal shank -- 4 oz has 141 mg. Lean ground turkey (93% lean) -- 4 oz has 118 mg. Fat-trimmed lamb loin -- 4 oz has 106 mg. Lean ground beef (90% lean) -- 4 oz has 100 mg. Lobster -- 3.5 oz has 90 mg. Pork loin chops -- 4 oz has 86 mg. Canned salmon -- 3.5 oz has 83 mg. Fat-trimmed beef top loin -- 4 oz has 78 mg. Frankfurter -- 1 frank (3.5 oz) has 77 mg. Crab -- 3.5 oz has 71 mg. Roasted chicken without skin, white meat -- 4 oz has 66 mg. Light bologna -- 2 oz has 45 mg. Deli-cut turkey -- 2 oz has 31 mg. Canned tuna -- 3.5 oz has 31 mg. Bacon -- 1 oz has 29 mg. Oysters and mussels (raw) -- 3.5 oz has 25 mg. Mackerel -- 1 oz has 22 mg. Trout -- 1 oz has 20 mg. Pork sausage -- 1 link (1 oz) has 17 mg. Salmon -- 1 oz has 16 mg. Tilapia -- 1 oz has 14 mg. Dairy Soft-serve ice cream --  cup (4 oz) has 103 mg. Whole-milk yogurt -- 1 cup (8 oz) has 29 mg. Cheddar cheese -- 1 oz has 28 mg. American cheese -- 1 oz has 28 mg. Whole milk -- 1 cup (8 oz) has 23 mg. 2% milk -- 1 cup (8 oz) has 18 mg. Cream cheese -- 1 tablespoon (Tbsp) has 15 mg. Cottage cheese --  cup (4 oz) has 14 mg. Low-fat (1%) milk -- 1 cup (8 oz) has 10 mg. Sour cream -- 1 Tbsp has 8.5 mg. Low-fat yogurt -- 1 cup (8 oz) has 8 mg. Nonfat Greek  yogurt -- 1 cup (8 oz) has 7 mg. Half-and-half cream -- 1 Tbsp has 5 mg. Fats and oils Cod liver oil -- 1 tablespoon (Tbsp) has 82 mg. Butter -- 1 Tbsp has 15 mg. Lard -- 1 Tbsp has 14 mg. Bacon grease -- 1 Tbsp has 14 mg. Mayonnaise -- 1 Tbsp has 5-10 mg. Margarine -- 1 Tbsp has 3-10 mg. Exact amounts of cholesterol in these foods may vary depending on specific ingredients and brands. Foods without cholesterol Most plant-based foods do not have cholesterol unless you combine them with a food that has cholesterol. Foods without cholesterol include: Grains and cereals. Vegetables. Fruits. Vegetable oils, such as olive, canola, and sunflower oil. Legumes, such as peas, beans, and lentils. Nuts and seeds. Egg whites. Summary The body needs cholesterol in small amounts, but too much cholesterol can cause damage to the arteries and heart. Most people should eat less than 200 milligrams (mg) of cholesterol a day. This information is not intended to replace advice given to you by your health care provider. Make sure you discuss any questions you have with your health care provider. Document Revised: 04/23/2019 Document Reviewed: 06/03/2019 Elsevier Patient Education    2022 [place]. Hypertension, Adult Hypertension is another name for high blood pressure. High blood pressure forces your heart to work harder to pump blood. This can cause problems over time. There are two numbers in a blood pressure reading. There is a top number (systolic) over a bottom number (diastolic). It is best to have a blood pressure that is below 120/80. Healthy choices can help lower your blood pressure, or you may need medicine to help lower it. What are the causes? The cause of this condition is not known. Some conditions may be related to high blood pressure. What increases the risk? Smoking. Having type 2 diabetes mellitus, high cholesterol, or both. Not getting enough exercise or physical activity. Being  overweight. Having too much fat, sugar, calories, or salt (sodium) in your diet. Drinking too much alcohol. Having long-term (chronic) kidney disease. Having a family history of high blood pressure. Age. Risk increases with age. Race. You may be at higher risk if you are African American. Gender. Men are at higher risk than women before age 37. After age 42, women are at higher risk than men. Having obstructive sleep apnea. Stress. What are the signs or symptoms? High blood pressure may not cause symptoms. Very high blood pressure (hypertensive crisis) may cause: Headache. Feelings of worry or nervousness (anxiety). Shortness of breath. Nosebleed. A feeling of being sick to your stomach (nausea). Throwing up (vomiting). Changes in how you see. Very bad chest pain. Seizures. How is this treated? This condition is treated by making healthy lifestyle changes, such as: Eating healthy foods. Exercising more. Drinking less alcohol. Your health care provider may prescribe medicine if lifestyle changes are not enough to get your blood pressure under control, and if: Your top number is above 130. Your bottom number is above 80. Your personal target blood pressure may vary. Follow these instructions at home: Eating and drinking  If told, follow the DASH eating plan. To follow this plan: Fill one half of your plate at each meal with fruits and vegetables. Fill one fourth of your plate at each meal with whole grains. Whole grains include whole-wheat pasta, brown rice, and whole-grain bread. Eat or drink low-fat dairy products, such as skim milk or low-fat yogurt. Fill one fourth of your plate at each meal with low-fat (lean) proteins. Low-fat proteins include fish, chicken without skin, eggs, beans, and tofu. Avoid fatty meat, cured and processed meat, or chicken with skin. Avoid pre-made or processed food. Eat less than 1,500 mg of salt each day. Do not drink alcohol if: Your doctor  tells you not to drink. You are pregnant, may be pregnant, or are planning to become pregnant. If you drink alcohol: Limit how much you use to: 0-1 drink a day for women. 0-2 drinks a day for men. Be aware of how much alcohol is in your drink. In the [place] one drink equals one 12 oz bottle of beer (355 mL), one 5 oz glass of wine (148 mL), or one 1 oz glass of hard liquor (44 mL). Lifestyle  Work with your doctor to stay at a healthy weight or to lose weight. Ask your doctor what the best weight is for you. Get at least 30 minutes of exercise most days of the week. This may include walking, swimming, or biking. Get at least 30 minutes of exercise that strengthens your muscles (resistance exercise) at least 3 days a week. This may include lifting weights or doing Pilates. Do not use any products that contain  nicotine or tobacco, such as cigarettes, e-cigarettes, and chewing tobacco. If you need help quitting, ask your doctor. Check your blood pressure at home as told by your doctor. Keep all follow-up visits as told by your doctor. This is important. Medicines Take over-the-counter and prescription medicines only as told by your doctor. Follow directions carefully. Do not skip doses of blood pressure medicine. The medicine does not work as well if you skip doses. Skipping doses also puts you at risk for problems. Ask your doctor about side effects or reactions to medicines that you should watch for. Contact a doctor if you: Think you are having a reaction to the medicine you are taking. Have headaches that keep coming back (recurring). Feel dizzy. Have swelling in your ankles. Have trouble with your vision. Get help right away if you: Get a very bad headache. Start to feel mixed up (confused). Feel weak or numb. Feel faint. Have very bad pain in your: Chest. Belly (abdomen). Throw up more than once. Have trouble breathing. Summary Hypertension is another name for high blood  pressure. High blood pressure forces your heart to work harder to pump blood. For most people, a normal blood pressure is less than 120/80. Making healthy choices can help lower blood pressure. If your blood pressure does not get lower with healthy choices, you may need to take medicine. This information is not intended to replace advice given to you by your health care provider. Make sure you discuss any questions you have with your health care provider. Document Revised: 09/20/2017 Document Reviewed: 09/20/2017 Elsevier Patient Education  [place].

## 2020-09-23 NOTE — Progress Notes (Signed)
Established Patient Office Visit  Subjective:  Patient ID: Matthew Weber, male    DOB: 1965-04-30  Age: 55 y.o. MRN: [id number]  CC:  Chief Complaint  Patient presents with   Medical Management of Chronic Issues    HPI Matthew Weber presents for follow up of hypertension. Patient was diagnosed in 08/17/2016 The patient is tolerating the medication well without side effects. Compliance with treatment has been good; including taking medication as directed , maintains a healthy diet and regular exercise regimen , and following up as directed. Current medication Norvasc 10 mg daily  Mixed hyperlipidemia  Pt presents with hyperlipidemia. Patient was diagnosed in }. Compliance with treatment has been good ; The patient is compliant with medications, maintains a low cholesterol diet , follows up as directed , and maintains an exercise regimen . The patient denies experiencing any hypercholesterolemia related symptoms.  Current medication atorvastatin 10 mg tablet by mouth daily.    Matthew Weber is a 55 y.o. male who complains of GERD type symptoms. He has been experiencing heartburn for many day(s), chronic over time. ROS: patient denies abdominal pain, cough, wheezing, or weight loss. Social history: nonsmoker, no or mild caffeine use.  Current Outpatient Medications  Medication Sig Dispense Refill   amLODipine (NORVASC) 10 MG tablet Take 1 tablet (10 mg total) by mouth daily. 90 tablet 1   aspirin 325 MG tablet Take 325 mg by mouth daily.     atorvastatin (LIPITOR) 10 MG tablet Take 1 tablet (10 mg total) by mouth daily. 90 tablet 1   No current facility-administered medications for this visit.       Past Medical History:  Diagnosis Date   Hyperlipidemia    Hypertension    Kidney stones     History reviewed. No pertinent surgical history.  Family History  Problem Relation Age of Onset   Diabetes Mother    Hypertension Mother    Heart attack Father     Social History    Socioeconomic History   Marital status: Married    Spouse name: Not on file   Number of children: 2   Years of education: Not on file   Highest education level: Not on file  Occupational History   Not on file  Tobacco Use   Smoking status: Never   Smokeless tobacco: Never  Vaping Use   Vaping Use: Never used  Substance and Sexual Activity   Alcohol use: No   Drug use: No   Sexual activity: Not on file  Other Topics Concern   Not on file  Social History Narrative   Not on file   Social Determinants of Health   Financial Resource Strain: Not on file  Food Insecurity: Not on file  Transportation Needs: Not on file  Physical Activity: Not on file  Stress: Not on file  Social Connections: Not on file  Intimate Partner Violence: Not on file    Outpatient Medications Prior to Visit  Medication Sig Dispense Refill   amLODipine (NORVASC) 10 MG tablet Take 1 tablet (10 mg total) by mouth daily. 90 tablet 1   aspirin 325 MG tablet Take 325 mg by mouth daily.     atorvastatin (LIPITOR) 10 MG tablet Take 1 tablet (10 mg total) by mouth daily. 90 tablet 1   Chlorpheniramine-DM (CORICIDIN HBP COUGH/COLD PO) Take by mouth.     Garlic 4982 MG CAPS Take 1,000 mg by mouth 2 (two) times daily.     loratadine (CLARITIN) 10 MG tablet  Take 1 tablet (10 mg total) by mouth daily. 90 tablet 3   Magnesium 250 MG TABS Take 250 mg by mouth 2 (two) times daily.     Omega-3 Fatty Acids (FISH OIL) 1200 MG CAPS Take 1,200 mg by mouth daily.     omeprazole (PRILOSEC) 20 MG capsule Take 20 mg by mouth daily.     No facility-administered medications prior to visit.    Allergies  Allergen Reactions   Ace Inhibitors Swelling    ROS Review of Systems  Constitutional: Negative.   HENT: Negative.    Eyes: Negative.   Respiratory: Negative.    Gastrointestinal: Negative.  Negative for constipation, diarrhea and nausea.  Genitourinary: Negative.   Skin:  Negative for rash.   Psychiatric/Behavioral: Negative.    All other systems reviewed and are negative.    Objective:    Physical Exam Vitals and nursing note reviewed.  Constitutional:      Appearance: Normal appearance.  HENT:     Head: Normocephalic.     Nose: Nose normal.     Mouth/Throat:     Mouth: Mucous membranes are moist.     Pharynx: Oropharynx is clear.  Eyes:     Conjunctiva/sclera: Conjunctivae normal.  Cardiovascular:     Rate and Rhythm: Normal rate and regular rhythm.  Pulmonary:     Effort: Pulmonary effort is normal.     Breath sounds: Normal breath sounds.  Abdominal:     General: Bowel sounds are normal.  Musculoskeletal:        General: Normal range of motion.  Skin:    Findings: No rash.  Neurological:     Mental Status: He is alert and oriented to person, place, and time.  Psychiatric:        Behavior: Behavior normal.    BP 126/79   Pulse (!) 53   Temp 97.8 F (36.6 C) (Temporal)   Ht _0  (1.727 m)   Wt 212 lb (96.2 kg)   SpO2 99%   BMI 32.23 kg/m  Wt Readings from Last 3 Encounters:  09/23/20 212 lb (96.2 kg)  06/23/20 209 lb (94.8 kg)  03/23/20 212 lb (96.2 kg)     Health Maintenance Due  Topic Date Due   Hepatitis C Screening  Never done   Zoster Vaccines- Shingrix (1 of 2) Never done   COVID-19 Vaccine (3 - Booster for Moderna series) 10/04/2019   INFLUENZA VACCINE  08/24/2020    There are no preventive care reminders to display for this patient.  Lab Results  Component Value Date   TSH 1.900 03/19/2019   Lab Results  Component Value Date   WBC 2.6 (L) 06/23/2020   HGB 14.5 06/23/2020   HCT 43.0 06/23/2020   MCV 91 06/23/2020   PLT 252 06/23/2020   Lab Results  Component Value Date   NA 142 06/23/2020   K 4.1 06/23/2020   CO2 22 06/23/2020   GLUCOSE 86 06/23/2020   BUN 14 06/23/2020   CREATININE 0.91 06/23/2020   BILITOT 1.5 (H) 06/23/2020   ALKPHOS 80 06/23/2020   AST 32 06/23/2020   ALT 38 06/23/2020   PROT 7.1  06/23/2020   ALBUMIN 4.7 06/23/2020   CALCIUM 9.9 06/23/2020   ANIONGAP 11 03/02/2020   EGFR 99 06/23/2020   Lab Results  Component Value Date   CHOL 176 06/23/2020   Lab Results  Component Value Date   HDL 55 06/23/2020   Lab Results  Component Value Date  LDLCALC 102 (H) 06/23/2020   Lab Results  Component Value Date   TRIG 106 06/23/2020   Lab Results  Component Value Date   CHOLHDL 3.2 06/23/2020   Lab Results  Component Value Date   HGBA1C 4.9 03/19/2019      Assessment & Plan:   Problem List Items Addressed This Visit       Cardiovascular and Mediastinum   Essential hypertension - Primary    Essential hypertension well-controlled on Norvasc 10 mg tablet by mouth daily.  No changes to current dose.  Continue low-sodium diet, exercise 3 times a week, follow-up in 6 months.  Rx refill sent to pharmacy.      Relevant Orders   CBC with Differential   Comprehensive metabolic panel     Digestive   Gastroesophageal reflux disease without esophagitis    The pathophysiology of reflux is discussed.  Anti-reflux measures such as raising the head of the bed, avoiding tight clothing or belts, avoiding eating late at night and not lying down shortly after mealtime and achieving weight loss are discussed. Avoid ASA, NSAID's, caffeine, peppermints, alcohol and tobacco. OTC H2 blockers and/or antacids are often very helpful for PRN use. However, for persisting chronic or daily symptoms, prescription strength H2 blockers or a trial of PPI's are often used. Further recommendations to him: He should alert me if there are persistent symptoms, dysphagia, weight loss or GI bleeding. FUV is scheduled. For 6 months, patient is only using over-the-counter Tums for now.        Other   Hyperlipidemia    No new signs and symptoms of hyperlipidemia.  Continue current dose of atorvastatin 10 mg tablet by mouth daily.  Recommendation low-cholesterol diet.  Completed lipid panel results  pending.  Follow-up in 6 months.        Relevant Orders   Lipid Panel    No orders of the defined types were placed in this encounter.   Follow-up: No follow-ups on file.    Ivy Lynn, NP

## 2020-09-23 NOTE — Assessment & Plan Note (Signed)
No new signs and symptoms of hyperlipidemia.  Continue current dose of atorvastatin 10 mg tablet by mouth daily.  Recommendation low-cholesterol diet.  Completed lipid panel results pending.  Follow-up in 6 months.

## 2020-09-23 NOTE — Assessment & Plan Note (Signed)
Essential hypertension well-controlled on Norvasc 10 mg tablet by mouth daily.  No changes to current dose.  Continue low-sodium diet, exercise 3 times a week, follow-up in 6 months.  Rx refill sent to pharmacy.

## 2021-02-09 ENCOUNTER — Encounter: Payer: Self-pay | Admitting: *Deleted

## 2021-03-23 ENCOUNTER — Ambulatory Visit (INDEPENDENT_AMBULATORY_CARE_PROVIDER_SITE_OTHER): Payer: 59 | Admitting: Nurse Practitioner

## 2021-03-23 ENCOUNTER — Encounter: Payer: Self-pay | Admitting: Nurse Practitioner

## 2021-03-23 VITALS — BP 135/86 | HR 59 | Temp 98.8°F | Ht 68.0 in | Wt 219.0 lb

## 2021-03-23 DIAGNOSIS — E782 Mixed hyperlipidemia: Secondary | ICD-10-CM

## 2021-03-23 DIAGNOSIS — I10 Essential (primary) hypertension: Secondary | ICD-10-CM

## 2021-03-23 DIAGNOSIS — K219 Gastro-esophageal reflux disease without esophagitis: Secondary | ICD-10-CM

## 2021-03-23 LAB — COMPREHENSIVE METABOLIC PANEL
ALT: 24 IU/L (ref 0–44)
AST: 27 IU/L (ref 0–40)
Albumin/Globulin Ratio: 2 (ref 1.2–2.2)
Albumin: 4.8 g/dL (ref 3.8–4.9)
Alkaline Phosphatase: 82 IU/L (ref 44–121)
BUN/Creatinine Ratio: 14 (ref 9–20)
BUN: 13 mg/dL (ref 6–24)
Bilirubin Total: 1.7 mg/dL — ABNORMAL HIGH (ref 0.0–1.2)
CO2: 22 mmol/L (ref 20–29)
Calcium: 9.9 mg/dL (ref 8.7–10.2)
Chloride: 102 mmol/L (ref 96–106)
Creatinine, Ser: 0.9 mg/dL (ref 0.76–1.27)
Globulin, Total: 2.4 g/dL (ref 1.5–4.5)
Glucose: 87 mg/dL (ref 70–99)
Potassium: 4.1 mmol/L (ref 3.5–5.2)
Sodium: 138 mmol/L (ref 134–144)
Total Protein: 7.2 g/dL (ref 6.0–8.5)
eGFR: 100 mL/min/{1.73_m2} (ref >59)

## 2021-03-23 LAB — CBC WITH DIFFERENTIAL/PLATELET
Basophils Absolute: 0 10*3/uL (ref 0.0–0.2)
Basos: 1 %
EOS (ABSOLUTE): 0.1 10*3/uL (ref 0.0–0.4)
Eos: 4 %
Hematocrit: 44.5 % (ref 37.5–51.0)
Hemoglobin: 15.5 g/dL (ref 13.0–17.7)
Immature Grans (Abs): 0 10*3/uL (ref 0.0–0.1)
Immature Granulocytes: 0 %
Lymphocytes Absolute: 0.8 10*3/uL (ref 0.7–3.1)
Lymphs: 29 %
MCH: 29.9 pg (ref 26.6–33.0)
MCHC: 34.8 g/dL (ref 31.5–35.7)
MCV: 86 fL (ref 79–97)
Monocytes Absolute: 0.3 10*3/uL (ref 0.1–0.9)
Monocytes: 12 %
Neutrophils Absolute: 1.5 10*3/uL (ref 1.4–7.0)
Neutrophils: 54 %
Platelets: 266 10*3/uL (ref 150–450)
RBC: 5.18 x10E6/uL (ref 4.14–5.80)
RDW: 13.4 % (ref 11.6–15.4)
WBC: 2.7 10*3/uL — ABNORMAL LOW (ref 3.4–10.8)

## 2021-03-23 LAB — LIPID PANEL
Chol/HDL Ratio: 3.2 ratio (ref 0.0–5.0)
Cholesterol, Total: 166 mg/dL (ref 100–199)
HDL: 52 mg/dL (ref >39)
LDL Chol Calc (NIH): 94 mg/dL (ref 0–99)
Triglycerides: 114 mg/dL (ref 0–149)
VLDL Cholesterol Cal: 20 mg/dL (ref 5–40)

## 2021-03-23 NOTE — Assessment & Plan Note (Signed)
No new signs or worsening symptoms of GERD.  Patient will continue over-the-counter medication as needed.

## 2021-03-23 NOTE — Assessment & Plan Note (Signed)
Patient's hypertension is well controlled on current medication no changes necessary.  Continues low-sodium diet exercise and weight loss.  Patient will return in 6 months.

## 2021-03-23 NOTE — Progress Notes (Signed)
Established Patient Office Visit  Subjective:  Patient ID: Matthew Weber, male    DOB: 1965/11/28  Age: 56 y.o. MRN: [id number]  CC:  Chief Complaint  Patient presents with   chronic disease management    HPI Matthew Weber presents for follow up of hypertension. Patient was diagnosed in 08/17/2016. The patient is tolerating the medication well without side effects. Compliance with treatment has been good; including taking medication as directed , maintains a healthy diet and regular exercise regimen , and following up as directed.   Mixed hyperlipidemia  Pt presents with hyperlipidemia. Patient was diagnosed in 03/08/2018 Compliance with treatment has been good; The patient is compliant with medications, maintains a low cholesterol diet , follows up as directed , and maintains an exercise regimen . The patient denies experiencing any hypercholesterolemia related symptoms.    GERD, Follow up:  The patient was last seen for GERD 6 months ago. Changes made since that visit include no changes made, OTC medication ad needed.  He reports good compliance with treatment. He is not having side effects. Marland Kitchen  He IS experiencing  no new or worsening symptoms . He is NOT experiencing bilious reflux, chest pain, choking on food, cough, deep pressure at base of neck, or dysphagia    Past Medical History:  Diagnosis Date   Hyperlipidemia    Hypertension    Kidney stones     History reviewed. No pertinent surgical history.  Family History  Problem Relation Age of Onset   Diabetes Mother    Hypertension Mother    Heart attack Father     Social History   Socioeconomic History   Marital status: Married    Spouse name: Not on file   Number of children: 2   Years of education: Not on file   Highest education level: Not on file  Occupational History   Not on file  Tobacco Use   Smoking status: Never   Smokeless tobacco: Never  Vaping Use   Vaping Use: Never used  Substance and Sexual  Activity   Alcohol use: No   Drug use: No   Sexual activity: Not on file  Other Topics Concern   Not on file  Social History Narrative   Not on file   Social Determinants of Health   Financial Resource Strain: Not on file  Food Insecurity: Not on file  Transportation Needs: Not on file  Physical Activity: Not on file  Stress: Not on file  Social Connections: Not on file  Intimate Partner Violence: Not on file    Outpatient Medications Prior to Visit  Medication Sig Dispense Refill   amLODipine (NORVASC) 10 MG tablet Take 1 tablet (10 mg total) by mouth daily. 90 tablet 1   aspirin 325 MG tablet Take 325 mg by mouth daily.     atorvastatin (LIPITOR) 10 MG tablet Take 1 tablet (10 mg total) by mouth daily. 90 tablet 1   No facility-administered medications prior to visit.    Allergies  Allergen Reactions   Ace Inhibitors Swelling    ROS Review of Systems  Constitutional: Negative.   HENT: Negative.    Eyes: Negative.   Respiratory: Negative.    Cardiovascular: Negative.   Gastrointestinal: Negative.   Musculoskeletal: Negative.   Skin: Negative.  Negative for rash.  Psychiatric/Behavioral: Negative.  The patient is not nervous/anxious.   All other systems reviewed and are negative.    Objective:    Physical Exam Vitals and nursing note reviewed.  Constitutional:      Appearance: He is obese.  HENT:     Head: Normocephalic.     Right Ear: External ear normal.     Left Ear: External ear normal.     Mouth/Throat:     Mouth: Mucous membranes are moist.     Pharynx: Oropharynx is clear.  Eyes:     Conjunctiva/sclera: Conjunctivae normal.  Cardiovascular:     Rate and Rhythm: Normal rate and regular rhythm.     Pulses: Normal pulses.     Heart sounds: Normal heart sounds.  Pulmonary:     Effort: Pulmonary effort is normal.     Breath sounds: Normal breath sounds.  Abdominal:     General: Bowel sounds are normal.  Musculoskeletal:        General: Normal  range of motion.  Skin:    General: Skin is warm.  Neurological:     Mental Status: He is alert and oriented to person, place, and time.  Psychiatric:        Behavior: Behavior normal.    BP 135/86    Pulse (!) 59    Temp 98.8 F (37.1 C)    Ht 5' 8"  (1.727 m)    Wt 219 lb (99.3 kg)    SpO2 97%    BMI 33.30 kg/m  Wt Readings from Last 3 Encounters:  03/23/21 219 lb (99.3 kg)  09/23/20 212 lb (96.2 kg)  06/23/20 209 lb (94.8 kg)     There are no preventive care reminders to display for this patient.  There are no preventive care reminders to display for this patient.  Lab Results  Component Value Date   TSH 1.900 03/19/2019   Lab Results  Component Value Date   WBC 3.1 (L) 09/23/2020   HGB 14.5 09/23/2020   HCT 42.8 09/23/2020   MCV 88 09/23/2020   PLT 262 09/23/2020   Lab Results  Component Value Date   NA 140 09/23/2020   K 4.0 09/23/2020   CO2 24 09/23/2020   GLUCOSE 81 09/23/2020   BUN 13 09/23/2020   CREATININE 0.96 09/23/2020   BILITOT 2.2 (H) 09/23/2020   ALKPHOS 77 09/23/2020   AST 23 09/23/2020   ALT 20 09/23/2020   PROT 7.2 09/23/2020   ALBUMIN 4.8 09/23/2020   CALCIUM 10.0 09/23/2020   ANIONGAP 11 03/02/2020   EGFR 93 09/23/2020   Lab Results  Component Value Date   CHOL 157 09/23/2020   Lab Results  Component Value Date   HDL 51 09/23/2020   Lab Results  Component Value Date   LDLCALC 87 09/23/2020   Lab Results  Component Value Date   TRIG 106 09/23/2020   Lab Results  Component Value Date   CHOLHDL 3.1 09/23/2020   Lab Results  Component Value Date   HGBA1C 4.9 03/19/2019      Assessment & Plan:   Problem List Items Addressed This Visit       Cardiovascular and Mediastinum   Essential hypertension - Primary    Patient's hypertension is well controlled on current medication no changes necessary.  Continues low-sodium diet exercise and weight loss.  Patient will return in 6 months.      Relevant Orders   Lipid Panel    CBC with Differential   Comprehensive metabolic panel     Digestive   Gastroesophageal reflux disease without esophagitis    No new signs or worsening symptoms of GERD.  Patient will continue over-the-counter medication as  needed.        Other   Hyperlipidemia    Hyperlipidemia well-controlled on current medication no changes necessary.  Continue low-cholesterol diet exercise and weight loss.       No orders of the defined types were placed in this encounter.   Follow-up: Return in about 6 months (around 09/20/2021), or if symptoms worsen or fail to improve.    Ivy Lynn, NP

## 2021-03-23 NOTE — Assessment & Plan Note (Signed)
Hyperlipidemia well-controlled on current medication no changes necessary.  Continue low-cholesterol diet exercise and weight loss.

## 2021-03-23 NOTE — Patient Instructions (Signed)
Cholesterol Content in Foods Cholesterol is a waxy, fat-like substance that helps to carry fat in the blood. The body needs cholesterol in small amounts, but too much cholesterol can cause damage to the arteries and heart. What foods have cholesterol? Cholesterol is found in animal-based foods, such as meat, seafood, and dairy. Generally, low-fat dairy and lean meats have less cholesterol than full-fat dairy and fatty meats. The milligrams of cholesterol per serving (mg per serving) of common cholesterol-containing foods are listed below. Meats and other proteins Egg -- one large whole egg has 186 mg. Veal shank -- 4 oz (113 g) has 141 mg. Lean ground [place] (93% lean) -- 4 oz (113 g) has 118 mg. Fat-trimmed lamb loin -- 4 oz (113 g) has 106 mg. Lean ground beef (90% lean) -- 4 oz (113 g) has 100 mg. Lobster -- 3.5 oz (99 g) has 90 mg. Pork loin chops -- 4 oz (113 g) has 86 mg. Canned salmon -- 3.5 oz (99 g) has 83 mg. Fat-trimmed beef top loin -- 4 oz (113 g) has 78 mg. Frankfurter -- 1 frank (3.5 oz or 99 g) has 77 mg. Crab -- 3.5 oz (99 g) has 71 mg. Roasted chicken without skin, white meat -- 4 oz (113 g) has 66 mg. Light bologna -- 2 oz (57 g) has 45 mg. Deli-cut [place] -- 2 oz (57 g) has 31 mg. Canned tuna -- 3.5 oz (99 g) has 31 mg. Berniece Salines -- 1 oz (28 g) has 29 mg. Oysters and mussels (raw) -- 3.5 oz (99 g) has 25 mg. Mackerel -- 1 oz (28 g) has 22 mg. Trout -- 1 oz (28 g) has 20 mg. Pork sausage -- 1 link (1 oz or 28 g) has 17 mg. Salmon -- 1 oz (28 g) has 16 mg. Tilapia -- 1 oz (28 g) has 14 mg. Dairy Soft-serve ice cream --  cup (4 oz or 86 g) has 103 mg. Whole-milk yogurt -- 1 cup (8 oz or 245 g) has 29 mg. Cheddar cheese -- 1 oz (28 g) has 28 mg. American cheese -- 1 oz (28 g) has 28 mg. Whole milk -- 1 cup (8 oz or 250 mL) has 23 mg. 2% milk -- 1 cup (8 oz or 250 mL) has 18 mg. Cream cheese -- 1 tablespoon (Tbsp) (14.5 g) has 15 mg. Cottage cheese --  cup (4 oz or 113  g) has 14 mg. Low-fat (1%) milk -- 1 cup (8 oz or 250 mL) has 10 mg. Sour cream -- 1 Tbsp (12 g) has 8.5 mg. Low-fat yogurt -- 1 cup (8 oz or 245 g) has 8 mg. Nonfat Greek yogurt -- 1 cup (8 oz or 228 g) has 7 mg. Half-and-half cream -- 1 Tbsp (15 mL) has 5 mg. Fats and oils Cod liver oil -- 1 tablespoon (Tbsp) (13.6 g) has 82 mg. Butter -- 1 Tbsp (14 g) has 15 mg. Lard -- 1 Tbsp (12.8 g) has 14 mg. Bacon grease -- 1 Tbsp (12.9 g) has 14 mg. Mayonnaise -- 1 Tbsp (13.8 g) has 5-10 mg. Margarine -- 1 Tbsp (14 g) has 3-10 mg. The items listed above may not be a complete list of foods with cholesterol. Exact amounts of cholesterol in these foods may vary depending on specific ingredients and brands. Contact a dietitian for more information. What foods do not have cholesterol? Most plant-based foods do not have cholesterol unless you combine them with a food that has cholesterol.  Foods without cholesterol include: Grains and cereals. Vegetables. Fruits. Vegetable oils, such as olive, canola, and sunflower oil. Legumes, such as peas, beans, and lentils. Nuts and seeds. Egg whites. The items listed above may not be a complete list of foods that do not have cholesterol. Contact a dietitian for more information. Summary The body needs cholesterol in small amounts, but too much cholesterol can cause damage to the arteries and heart. Cholesterol is found in animal-based foods, such as meat, seafood, and dairy. Generally, low-fat dairy and lean meats have less cholesterol than full-fat dairy and fatty meats. This information is not intended to replace advice given to you by your health care provider. Make sure you discuss any questions you have with your health care provider. Document Revised: 05/22/2020 Document Reviewed: 05/22/2020 Elsevier Patient Education  2022 [employer] for Gastroesophageal Reflux Disease, Adult When you have gastroesophageal reflux disease (GERD), the  foods you eat and your eating habits are very important. Choosing the right foods can help ease your discomfort. Think about working with a food expert (dietitian) to help you make good choices. What are tips for following this plan? Reading food labels Look for foods that are low in saturated fat. Foods that may help with your symptoms include: Foods that have less than 5% of daily value (DV) of fat. Foods that have 0 grams of trans fat. Cooking Do not fry your food. Cook your food by baking, steaming, grilling, or broiling. These are all methods that do not need a lot of fat for cooking. To add flavor, try to use herbs that are low in spice and acidity. Meal planning  Choose healthy foods that are low in fat, such as: Fruits and vegetables. Whole grains. Low-fat dairy products. Lean meats, fish, and poultry. Eat small meals often instead of eating 3 large meals each day. Eat your meals slowly in a place where you are relaxed. Avoid bending over or lying down until 2-3 hours after eating. Limit high-fat foods such as fatty meats or fried foods. Limit your intake of fatty foods, such as oils, butter, and shortening. Avoid the following as told by your doctor: Foods that cause symptoms. These may be different for different people. Keep a food diary to keep track of foods that cause symptoms. Alcohol. Drinking a lot of liquid with meals. Eating meals during the 2-3 hours before bed. Lifestyle Stay at a healthy weight. Ask your doctor what weight is healthy for you. If you need to lose weight, work with your doctor to do so safely. Exercise for at least 30 minutes on 5 or more days each week, or as told by your doctor. Wear loose-fitting clothes. Do not smoke or use any products that contain nicotine or tobacco. If you need help quitting, ask your doctor. Sleep with the head of your bed higher than your feet. Use a wedge under the mattress or blocks under the bed frame to raise the head of  the bed. Chew sugar-free gum after meals. What foods should eat? Eat a healthy, well-balanced diet of fruits, vegetables, whole grains, low-fat dairy products, lean meats, fish, and poultry. Each person is different. Foods that may cause symptoms in one person may not cause any symptoms in another person. Work with your doctor to find foods that are safe for you. The items listed above may not be a complete list of what you can eat and drink. Contact a food expert for more options. What foods should I avoid?  Limiting some of these foods may help in managing the symptoms of GERD. Everyone is different. Talk with a food expert or your doctor to help you find the exact foods to avoid, if any. Fruits Any fruits prepared with added fat. Any fruits that cause symptoms. For some people, this may include citrus fruits, such as oranges, grapefruit, pineapple, and lemons. Vegetables Deep-fried vegetables. [place] fries. Any vegetables prepared with added fat. Any vegetables that cause symptoms. For some people, this may include tomatoes and tomato products, chili peppers, onions and garlic, and horseradish. Grains Pastries or quick breads with added fat. Meats and other proteins High-fat meats, such as fatty beef or pork, hot dogs, ribs, ham, sausage, salami, and bacon. Fried meat or protein, including fried fish and fried chicken. Nuts and nut butters, in large amounts. Dairy Whole milk and chocolate milk. Sour cream. Cream. Ice cream. Cream cheese. Milkshakes. Fats and oils Butter. Margarine. Shortening. Ghee. Beverages Coffee and tea, with or without caffeine. Carbonated beverages. Sodas. Energy drinks. Fruit juice made with acidic fruits, such as orange or grapefruit. Tomato juice. Alcoholic drinks. Sweets and desserts Chocolate and cocoa. Donuts. Seasonings and condiments Pepper. Peppermint and spearmint. Added salt. Any condiments, herbs, or seasonings that cause symptoms. For some people, this  may include curry, hot sauce, or vinegar-based salad dressings. The items listed above may not be a complete list of what you should not eat and drink. Contact a food expert for more options. Questions to ask your doctor Diet and lifestyle changes are often the first steps that are taken to manage symptoms of GERD. If diet and lifestyle changes do not help, talk with your doctor about taking medicines. Where to find more information [employer] for Gastrointestinal Disorders: aboutgerd.org Summary When you have GERD, food and lifestyle choices are very important in easing your symptoms. Eat small meals often instead of 3 large meals a day. Eat your meals slowly and in a place where you are relaxed. Avoid bending over or lying down until 2-3 hours after eating. Limit high-fat foods such as fatty meats or fried foods. This information is not intended to replace advice given to you by your health care provider. Make sure you discuss any questions you have with your health care provider. Document Revised: 07/22/2019 Document Reviewed: 07/22/2019 Elsevier Patient Education  2022 [place]. Hypertension, Adult Hypertension is another name for high blood pressure. High blood pressure forces your heart to work harder to pump blood. This can cause problems over time. There are two numbers in a blood pressure reading. There is a top number (systolic) over a bottom number (diastolic). It is best to have a blood pressure that is below 120/80. Healthy choices can help lower your blood pressure, or you may need medicine to help lower it. What are the causes? The cause of this condition is not known. Some conditions may be related to high blood pressure. What increases the risk? Smoking. Having type 2 diabetes mellitus, high cholesterol, or both. Not getting enough exercise or physical activity. Being overweight. Having too much fat, sugar, calories, or salt (sodium) in your diet. Drinking  too much alcohol. Having long-term (chronic) kidney disease. Having a family history of high blood pressure. Age. Risk increases with age. Race. You may be at higher risk if you are African American. Gender. Men are at higher risk than women before age 37. After age 56, women are at higher risk than men. Having obstructive sleep apnea. Stress. What are the signs  or symptoms? High blood pressure may not cause symptoms. Very high blood pressure (hypertensive crisis) may cause: Headache. Feelings of worry or nervousness (anxiety). Shortness of breath. Nosebleed. A feeling of being sick to your stomach (nausea). Throwing up (vomiting). Changes in how you see. Very bad chest pain. Seizures. How is this treated? This condition is treated by making healthy lifestyle changes, such as: Eating healthy foods. Exercising more. Drinking less alcohol. Your health care provider may prescribe medicine if lifestyle changes are not enough to get your blood pressure under control, and if: Your top number is above 130. Your bottom number is above 80. Your personal target blood pressure may vary. Follow these instructions at home: Eating and drinking  If told, follow the DASH eating plan. To follow this plan: Fill one half of your plate at each meal with fruits and vegetables. Fill one fourth of your plate at each meal with whole grains. Whole grains include whole-wheat pasta, brown rice, and whole-grain bread. Eat or drink low-fat dairy products, such as skim milk or low-fat yogurt. Fill one fourth of your plate at each meal with low-fat (lean) proteins. Low-fat proteins include fish, chicken without skin, eggs, beans, and tofu. Avoid fatty meat, cured and processed meat, or chicken with skin. Avoid pre-made or processed food. Eat less than 1,500 mg of salt each day. Do not drink alcohol if: Your doctor tells you not to drink. You are pregnant, may be pregnant, or are planning to become  pregnant. If you drink alcohol: Limit how much you use to: 0-1 drink a day for women. 0-2 drinks a day for men. Be aware of how much alcohol is in your drink. In the [place] one drink equals one 12 oz bottle of beer (355 mL), one 5 oz glass of wine (148 mL), or one 1 oz glass of hard liquor (44 mL). Lifestyle  Work with your doctor to stay at a healthy weight or to lose weight. Ask your doctor what the best weight is for you. Get at least 30 minutes of exercise most days of the week. This may include walking, swimming, or biking. Get at least 30 minutes of exercise that strengthens your muscles (resistance exercise) at least 3 days a week. This may include lifting weights or doing Pilates. Do not use any products that contain nicotine or tobacco, such as cigarettes, e-cigarettes, and chewing tobacco. If you need help quitting, ask your doctor. Check your blood pressure at home as told by your doctor. Keep all follow-up visits as told by your doctor. This is important. Medicines Take over-the-counter and prescription medicines only as told by your doctor. Follow directions carefully. Do not skip doses of blood pressure medicine. The medicine does not work as well if you skip doses. Skipping doses also puts you at risk for problems. Ask your doctor about side effects or reactions to medicines that you should watch for. Contact a doctor if you: Think you are having a reaction to the medicine you are taking. Have headaches that keep coming back (recurring). Feel dizzy. Have swelling in your ankles. Have trouble with your vision. Get help right away if you: Get a very bad headache. Start to feel mixed up (confused). Feel weak or numb. Feel faint. Have very bad pain in your: Chest. Belly (abdomen). Throw up more than once. Have trouble breathing. Summary Hypertension is another name for high blood pressure. High blood pressure forces your heart to work harder to pump blood. For most  people, a  normal blood pressure is less than 120/80. Making healthy choices can help lower blood pressure. If your blood pressure does not get lower with healthy choices, you may need to take medicine. This information is not intended to replace advice given to you by your health care provider. Make sure you discuss any questions you have with your health care provider. Document Revised: 09/20/2017 Document Reviewed: 09/20/2017 Elsevier Patient Education  [place].

## 2021-08-13 IMAGING — DX DG CHEST 1V PORT
1 series · 1 of 1 positions shown · non-contrast
Comparison: CT Abdomen and Pelvis 06/30/2019.

CLINICAL DATA: 55-year-old male with chest pain since yesterday
radiating to the left shoulder.

EXAM:
PORTABLE CHEST 1 VIEW

[chest ap]
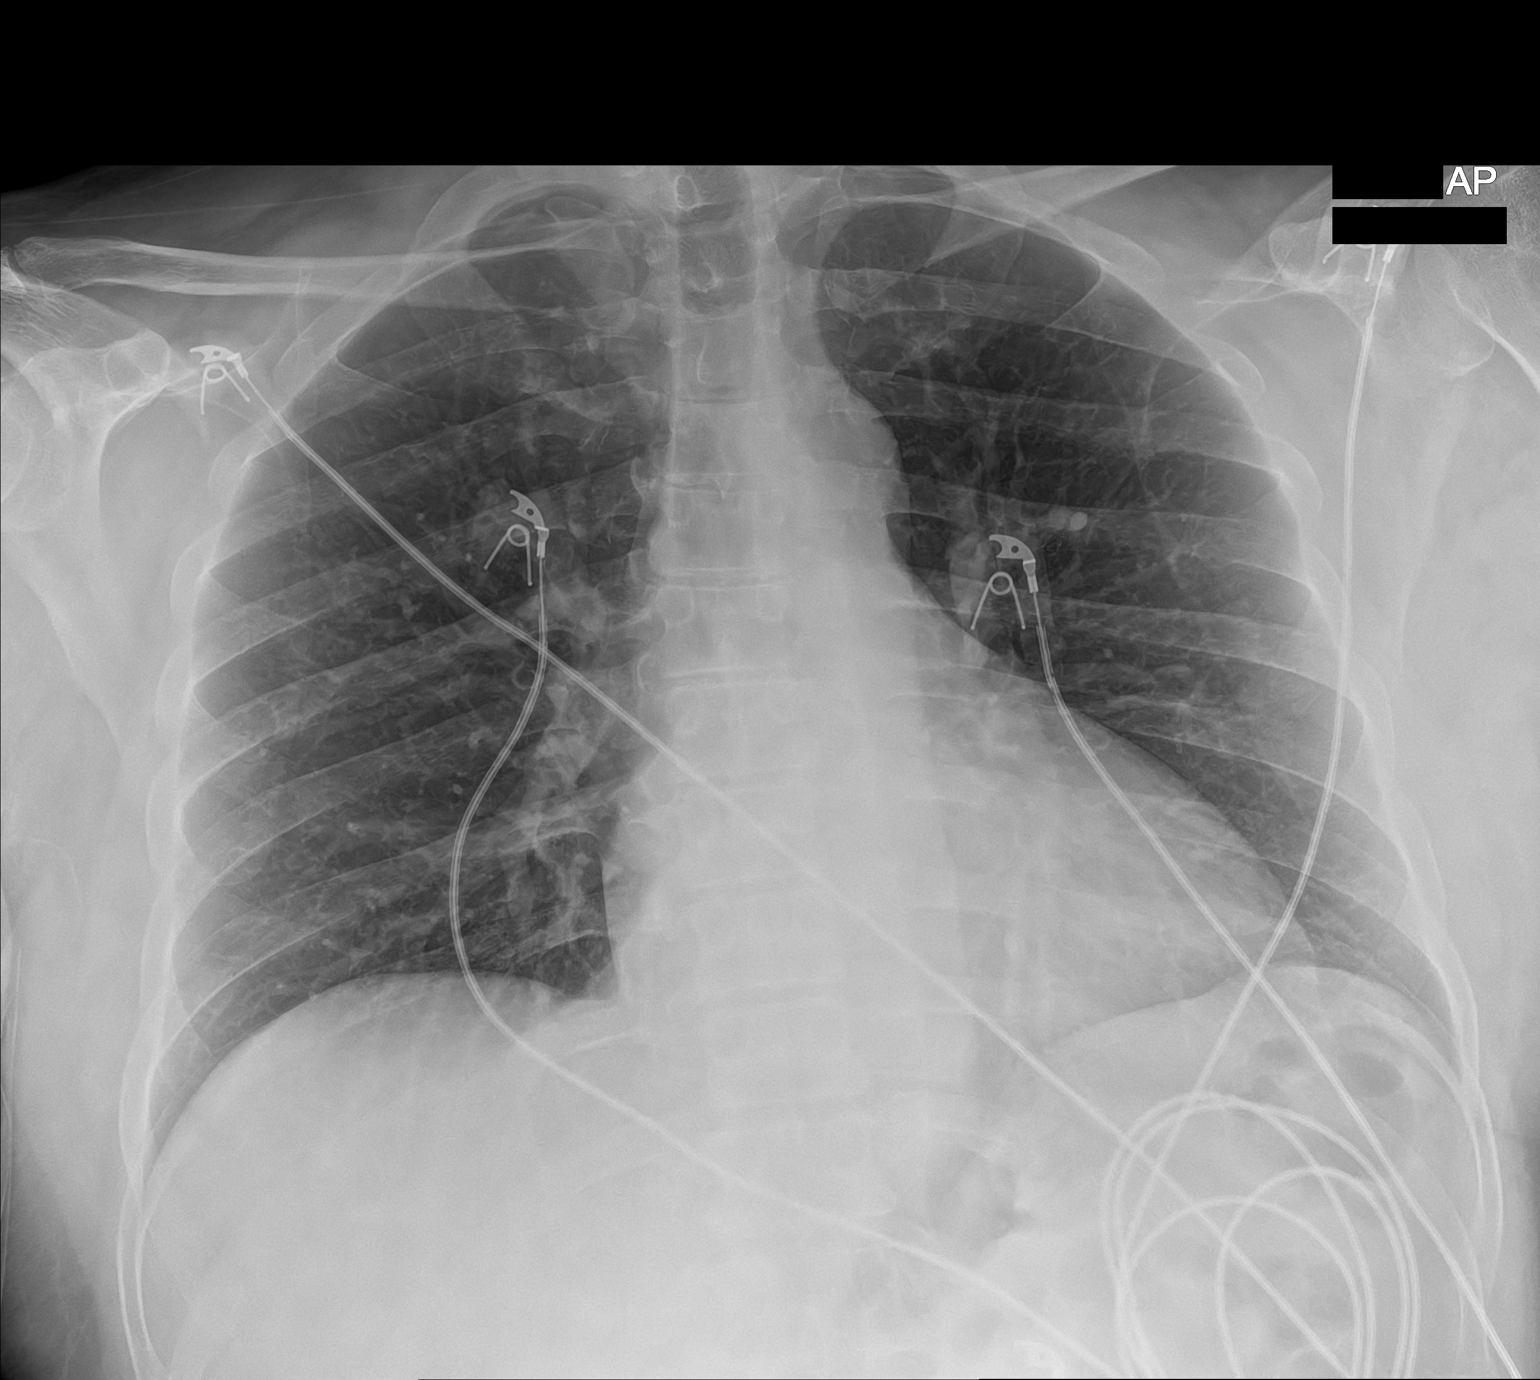

[1 of 1 positions shown; findings below may reference images not displayed]

FINDINGS: Portable AP upright view at 1929 hours. Normal lung volumes and
mediastinal contours. Visualized tracheal air column is within
normal limits. No pneumothorax. Allowing for portable technique the
lungs are clear. Negative visible bowel gas pattern and osseous
structures.
IMPRESSION: Negative portable chest.

## 2021-09-13 ENCOUNTER — Encounter: Payer: 59 | Admitting: Nurse Practitioner

## 2021-09-20 ENCOUNTER — Ambulatory Visit: Payer: 59 | Admitting: Nurse Practitioner

## 2021-09-27 ENCOUNTER — Emergency Department (HOSPITAL_COMMUNITY): Payer: 59

## 2021-09-27 ENCOUNTER — Emergency Department (HOSPITAL_COMMUNITY)
Admission: EM | Admit: 2021-09-27 | Discharge: 2021-09-27 | Disposition: A | Discharge status: Home / Self Care | Payer: 59 | Attending: Emergency Medicine | Admitting: Emergency Medicine

## 2021-09-27 ENCOUNTER — Encounter (HOSPITAL_COMMUNITY): Payer: Self-pay | Admitting: Emergency Medicine

## 2021-09-27 ENCOUNTER — Other Ambulatory Visit: Payer: Self-pay

## 2021-09-27 DIAGNOSIS — I10 Essential (primary) hypertension: Secondary | ICD-10-CM | POA: Insufficient documentation

## 2021-09-27 DIAGNOSIS — R5383 Other fatigue: Secondary | ICD-10-CM

## 2021-09-27 DIAGNOSIS — Z7982 Long term (current) use of aspirin: Secondary | ICD-10-CM | POA: Insufficient documentation

## 2021-09-27 DIAGNOSIS — R002 Palpitations: Secondary | ICD-10-CM | POA: Diagnosis not present

## 2021-09-27 DIAGNOSIS — R001 Bradycardia, unspecified: Secondary | ICD-10-CM | POA: Insufficient documentation

## 2021-09-27 DIAGNOSIS — Z79899 Other long term (current) drug therapy: Secondary | ICD-10-CM | POA: Diagnosis not present

## 2021-09-27 LAB — BASIC METABOLIC PANEL
Anion gap: 6 (ref 5–15)
BUN: 13 mg/dL (ref 6–20)
CO2: 25 mmol/L (ref 22–32)
Calcium: 9.1 mg/dL (ref 8.9–10.3)
Chloride: 107 mmol/L (ref 98–111)
Creatinine, Ser: 0.93 mg/dL (ref 0.61–1.24)
GFR, Estimated: >60 mL/min (ref >60)
Glucose, Bld: 100 mg/dL — ABNORMAL HIGH (ref 70–99)
Potassium: 3.7 mmol/L (ref 3.5–5.1)
Sodium: 138 mmol/L (ref 135–145)

## 2021-09-27 LAB — CBC
HCT: 43 % (ref 39.0–52.0)
Hemoglobin: 14.7 g/dL (ref 13.0–17.0)
MCH: 30.4 pg (ref 26.0–34.0)
MCHC: 34.2 g/dL (ref 30.0–36.0)
MCV: 88.8 fL (ref 80.0–100.0)
Platelets: 252 10*3/uL (ref 150–400)
RBC: 4.84 MIL/uL (ref 4.22–5.81)
RDW: 13.5 % (ref 11.5–15.5)
WBC: 2.7 10*3/uL — ABNORMAL LOW (ref 4.0–10.5)
nRBC: 0 % (ref 0.0–0.2)

## 2021-09-27 NOTE — ED Triage Notes (Signed)
Pt to the ED with complaints of fatigue that began this morning when he walked to dog.  Pt states he feels like his heart was racing and he has no energy. He states he feels some nervousness.

## 2021-09-27 NOTE — ED Notes (Signed)
EDP at [facility] 

## 2021-09-27 NOTE — ED Provider Notes (Signed)
[facility] EMERGENCY DEPARTMENT Provider Note   CSN: [id number] Arrival date & time: 09/27/21  1104     History  Chief Complaint  Patient presents with   Fatigue    Matthew Weber is a 56 y.o. male.  Patient with complaint of fatigue actually been going on for several weeks.  Today when he was walking his dog he felt as if he got heart palpitations he does not feel like he has any energy.  No sniffing complaint of any chest pain or shortness of breath.  No complaint of weakness or numbness to any particular extremity.  No headache.  No fevers no upper respiratory symptoms no cough.  Past medical history is significant for hypertension hyperlipidemia patient is a non-smoker.  Patient's oxygen sats here have all been in the upper 90s.  Heart rate been in the 50s.  Patient for his high blood pressure is on Norvasc.  And on Lipitor.  Patient does have a primary care provider.       Home Medications Prior to Admission medications   Medication Sig Start Date End Date Taking? Authorizing Provider  amLODipine (NORVASC) 10 MG tablet Take 1 tablet (10 mg total) by mouth daily. 09/23/20  Yes Ivy Lynn, NP  aspirin 325 MG tablet Take 325 mg by mouth daily.   Yes [provider]  atorvastatin (LIPITOR) 10 MG tablet Take 1 tablet (10 mg total) by mouth daily. Patient not taking: Reported on 09/27/2021 09/23/20   Ivy Lynn, NP      Allergies    Ace inhibitors    Review of Systems   Review of Systems  Constitutional:  Positive for fatigue. Negative for chills and fever.  HENT:  Negative for ear pain and sore throat.   Eyes:  Negative for pain and visual disturbance.  Respiratory:  Negative for cough and shortness of breath.   Cardiovascular:  Positive for palpitations. Negative for chest pain.  Gastrointestinal:  Negative for abdominal pain and vomiting.  Genitourinary:  Negative for dysuria and hematuria.  Musculoskeletal:  Negative for arthralgias and back pain.  Skin:   Negative for color change and rash.  Neurological:  Positive for weakness. Negative for seizures and syncope.  All other systems reviewed and are negative.   Physical Exam Updated Vital Signs BP (!) 138/93   Pulse (!) 48   Temp 98.6 F (37 C) (Oral)   Resp 12   Ht 1.753 m ('5\' 9"'$ )   Wt 91.6 kg   SpO2 100%   BMI 29.83 kg/m  Physical Exam Vitals and nursing note reviewed.  Constitutional:      General: He is not in acute distress.    Appearance: Normal appearance. He is well-developed.  HENT:     Head: Normocephalic and atraumatic.     Mouth/Throat:     Mouth: Mucous membranes are moist.  Eyes:     Conjunctiva/sclera: Conjunctivae normal.     Pupils: Pupils are equal, round, and reactive to light.  Cardiovascular:     Rate and Rhythm: Regular rhythm. Bradycardia present.     Heart sounds: No murmur heard. Pulmonary:     Effort: Pulmonary effort is normal. No respiratory distress.     Breath sounds: Normal breath sounds. No wheezing, rhonchi or rales.  Abdominal:     Palpations: Abdomen is soft.     Tenderness: There is no abdominal tenderness.  Musculoskeletal:        General: No swelling.     Cervical back:  Normal range of motion and neck supple. No rigidity.     Right lower leg: No edema.     Left lower leg: No edema.  Skin:    General: Skin is warm and dry.     Capillary Refill: Capillary refill takes less than 2 seconds.  Neurological:     General: No focal deficit present.     Mental Status: He is alert and oriented to person, place, and time.     Cranial Nerves: No cranial nerve deficit.     Sensory: No sensory deficit.     Motor: No weakness.  Psychiatric:        Mood and Affect: Mood normal.     ED Results / Procedures / Treatments   Labs (all labs ordered are listed, but only abnormal results are displayed) Labs Reviewed  BASIC METABOLIC PANEL - Abnormal; Notable for the following components:      Result Value   Glucose, Bld 100 (*)    All other  components within normal limits  CBC - Abnormal; Notable for the following components:   WBC 2.7 (*)    All other components within normal limits    EKG EKG Interpretation  Date/Time:  Monday September 27 2021 11:31:54 EDT Ventricular Rate:  58 PR Interval:  256 QRS Duration: 109 QT Interval:  433 QTC Calculation: 426 R Axis:   21 Text Interpretation: Sinus rhythm Prolonged PR interval Inferior infarct, old Borderline ST elevation, anterolateral leads No significant change since last tracing Confirmed by Fredia Sorrow [phone]) on 09/27/2021 2:10:53 PM  Radiology DG Chest Port 1 View  Result Date: 09/27/2021 CLINICAL DATA:  Palpitations, fatigue EXAM: PORTABLE CHEST 1 VIEW COMPARISON:  Radiograph 03/02/2020 FINDINGS: Borderline enlarged cardiac silhouette. No focal airspace disease. No pleural effusion. No pneumothorax. No acute osseous abnormality. Mild degenerative changes of the spine. IMPRESSION: Borderline enlarged cardiac silhouette. No focal airspace consolidation. Electronically Signed   By: Maurine Simmering M.D.   On: 09/27/2021 14:58    Procedures Procedures    Medications Ordered in ED Medications - No data to display  ED Course/ Medical Decision Making/ A&P                           Medical Decision Making Amount and/or Complexity of Data Reviewed Labs: ordered. Radiology: ordered.   Work-up here very reassuring patient had cardiac monitoring for a prolonged period of time no arrhythmias.  EKG had no acute findings.  And also had no significant change from previous.  Maybe some borderline ST segment elevation.  Again patient had no chest pain.  Basic metabolic panel normal CBC White blood cell count 2.7 but it has been down  Some but it has had low white blood cell counts.  Chest x-ray negative.  Patient stable for discharge home follow-up with primary care doctor.  Could consider thyroid check and even referral to cardiology for cardiac monitoring.  Patient will  return for any new or worse symptoms.  Certainly regarding the slow heart rate.  No evidence of any heart block.  His Norvasc did not slow his heart rate down more. Final Clinical Impression(s) / ED Diagnoses Final diagnoses:  Other fatigue  Palpitations    Rx / DC Orders ED Discharge Orders     None         Fredia Sorrow, MD 09/27/21 1540

## 2021-09-27 NOTE — Discharge Instructions (Signed)
Work-up here today without any acute findings.  Labs normal chest x-ray without any acute abnormalities.  Cardiac monitoring without any significant arrhythmia.  Follow-up with your regular doctor.  Return for any new or worse symptoms.  Work note provided.

## 2021-10-05 ENCOUNTER — Ambulatory Visit (INDEPENDENT_AMBULATORY_CARE_PROVIDER_SITE_OTHER): Payer: 59 | Admitting: Nurse Practitioner

## 2021-10-05 ENCOUNTER — Encounter: Payer: Self-pay | Admitting: Nurse Practitioner

## 2021-10-05 VITALS — BP 116/82 | HR 56 | Temp 98.7°F | Ht 69.0 in | Wt 204.0 lb

## 2021-10-05 DIAGNOSIS — Z0001 Encounter for general adult medical examination with abnormal findings: Secondary | ICD-10-CM | POA: Diagnosis not present

## 2021-10-05 DIAGNOSIS — I10 Essential (primary) hypertension: Secondary | ICD-10-CM | POA: Diagnosis not present

## 2021-10-05 DIAGNOSIS — Z0000 Encounter for general adult medical examination without abnormal findings: Secondary | ICD-10-CM

## 2021-10-05 NOTE — Assessment & Plan Note (Signed)
Hypertension well-controlled on current medication no changes necessary.  Continue low-sodium diet, exercise and weight loss.  Follow-up in 6 months.

## 2021-10-05 NOTE — Assessment & Plan Note (Signed)
Completed physical exam/head to toe assessment.  Patient has no new concerns.  Education provided and printed handouts given on health maintenance and preventative care.

## 2021-10-05 NOTE — Patient Instructions (Signed)
Health Maintenance, Male Adopting a healthy lifestyle and getting preventive care are important in promoting health and wellness. Ask your health care provider about: The right schedule for you to have regular tests and exams. Things you can do on your own to prevent diseases and keep yourself healthy. What should I know about diet, weight, and exercise? Eat a healthy diet  Eat a diet that includes plenty of vegetables, fruits, low-fat dairy products, and lean protein. Do not eat a lot of foods that are high in solid fats, added sugars, or sodium. Maintain a healthy weight Body mass index (BMI) is a measurement that can be used to identify possible weight problems. It estimates body fat based on height and weight. Your health care provider can help determine your BMI and help you achieve or maintain a healthy weight. Get regular exercise Get regular exercise. This is one of the most important things you can do for your health. Most adults should: Exercise for at least 150 minutes each week. The exercise should increase your heart rate and make you sweat (moderate-intensity exercise). Do strengthening exercises at least twice a week. This is in addition to the moderate-intensity exercise. Spend less time sitting. Even light physical activity can be beneficial. Watch cholesterol and blood lipids Have your blood tested for lipids and cholesterol at 57 years of age, then have this test every 5 years. You may need to have your cholesterol levels checked more often if: Your lipid or cholesterol levels are high. You are older than 57 years of age. You are at high risk for heart disease. What should I know about cancer screening? Many types of cancers can be detected early and may often be prevented. Depending on your health history and family history, you may need to have cancer screening at various ages. This may include screening for: Colorectal cancer. Prostate cancer. Skin cancer. Lung  cancer. What should I know about heart disease, diabetes, and high blood pressure? Blood pressure and heart disease High blood pressure causes heart disease and increases the risk of stroke. This is more likely to develop in people who have high blood pressure readings or are overweight. Talk with your health care provider about your target blood pressure readings. Have your blood pressure checked: Every 3-5 years if you are 18-39 years of age. Every year if you are 40 years old or older. If you are between the ages of 65 and 75 and are a current or former smoker, ask your health care provider if you should have a one-time screening for abdominal aortic aneurysm (AAA). Diabetes Have regular diabetes screenings. This checks your fasting blood sugar level. Have the screening done: Once every three years after age 45 if you are at a normal weight and have a low risk for diabetes. More often and at a younger age if you are overweight or have a high risk for diabetes. What should I know about preventing infection? Hepatitis B If you have a higher risk for hepatitis B, you should be screened for this virus. Talk with your health care provider to find out if you are at risk for hepatitis B infection. Hepatitis C Blood testing is recommended for: Everyone born from 1945 through 1965. Anyone with known risk factors for hepatitis C. Sexually transmitted infections (STIs) You should be screened each year for STIs, including gonorrhea and chlamydia, if: You are sexually active and are younger than 57 years of age. You are older than 57 years of age and your   health care provider tells you that you are at risk for this type of infection. Your sexual activity has changed since you were last screened, and you are at increased risk for chlamydia or gonorrhea. Ask your health care provider if you are at risk. Ask your health care provider about whether you are at high risk for HIV. Your health care provider  may recommend a prescription medicine to help prevent HIV infection. If you choose to take medicine to prevent HIV, you should first get tested for HIV. You should then be tested every 3 months for as long as you are taking the medicine. Follow these instructions at home: Alcohol use Do not drink alcohol if your health care provider tells you not to drink. If you drink alcohol: Limit how much you have to 0-2 drinks a day. Know how much alcohol is in your drink. In the [place] one drink equals one 12 oz bottle of beer (355 mL), one 5 oz glass of wine (148 mL), or one 1 oz glass of hard liquor (44 mL). Lifestyle Do not use any products that contain nicotine or tobacco. These products include cigarettes, chewing tobacco, and vaping devices, such as e-cigarettes. If you need help quitting, ask your health care provider. Do not use street drugs. Do not share needles. Ask your health care provider for help if you need support or information about quitting drugs. General instructions Schedule regular health, dental, and eye exams. Stay current with your vaccines. Tell your health care provider if: You often feel depressed. You have ever been abused or do not feel safe at home. Summary Adopting a healthy lifestyle and getting preventive care are important in promoting health and wellness. Follow your health care provider's instructions about healthy diet, exercising, and getting tested or screened for diseases. Follow your health care provider's instructions on monitoring your cholesterol and blood pressure. This information is not intended to replace advice given to you by your health care provider. Make sure you discuss any questions you have with your health care provider. Document Revised: 06/01/2020 Document Reviewed: 06/01/2020 Elsevier Patient Education  2023 [employer]  

## 2021-10-05 NOTE — Progress Notes (Signed)
Established Patient Office Visit  Subjective   Patient ID: Matthew Weber, male    DOB: 12-15-1965  Age: 56 y.o. MRN: [id number]  Chief Complaint  Patient presents with   Annual Exam    HPI .   Encounter for general adult medical examination  Physical: Patient's last physical exam was 1 year ago .  Weight: Appropriate for height (BMI greater than 27%) ;  Blood Pressure: Normal (BP less than 120/80) ;  Medical History: Patient history reviewed ; Family history reviewed ;  Allergies Reviewed: No change in current allergies ;  Medications Reviewed: Medications reviewed - no changes ;  Lipids: Normal lipid levels ; labs today results pending  Smoking: Life-long non-smoker  Physical Activity: Exercises at least 3 times per week Alcohol/Drug Use: Is a non-drinker ; No illicit drug use ;  Patient is not afflicted from Stress Incontinence and Urge Incontinence  Safety: reviewed ; Patient wears a seat belt, has smoke detectors, has carbon monoxide detectors, practices appropriate gun safety, and wears sunscreen with extended sun exposure. Dental Care: annual cleanings, brushes and flosses daily. Ophthalmology/Optometry: Annual visit.  Hearing loss: none Vision impairments: none    Patient Active Problem List   Diagnosis Date Noted   UTI symptoms 06/23/2020   Annual physical exam 03/23/2020   Gastroesophageal reflux disease without esophagitis 03/23/2020   Hyperlipidemia 03/08/2018   Essential hypertension 08/17/2016   Allergic rhinitis due to pollen 08/17/2016   Lipoma of neck 08/09/2016   Past Medical History:  Diagnosis Date   Hyperlipidemia    Hypertension    Kidney stones    History reviewed. No pertinent surgical history. Social History   Tobacco Use   Smoking status: Never   Smokeless tobacco: Never  Vaping Use   Vaping Use: Never used  Substance Use Topics   Alcohol use: No   Drug use: No   Social History   Socioeconomic History   Marital status: Married     Spouse name: Not on file   Number of children: 2   Years of education: Not on file   Highest education level: Not on file  Occupational History   Not on file  Tobacco Use   Smoking status: Never   Smokeless tobacco: Never  Vaping Use   Vaping Use: Never used  Substance and Sexual Activity   Alcohol use: No   Drug use: No   Sexual activity: Not on file  Other Topics Concern   Not on file  Social History Narrative   Not on file   Social Determinants of Health   Financial Resource Strain: Not on file  Food Insecurity: Not on file  Transportation Needs: Not on file  Physical Activity: Not on file  Stress: Not on file  Social Connections: Not on file  Intimate Partner Violence: Not on file   Family Status  Relation Name Status   Mother  Alive   Father  Deceased   Sister  Alive   Brother  Alive   Daughter  Alive   Son  Alive   Sister  Alive   Sister  Alive   Sister Half sisiter Alive   Brother  Alive   Family History  Problem Relation Age of Onset   Diabetes Mother    Hypertension Mother    Heart attack Father    Allergies  Allergen Reactions   Ace Inhibitors Swelling      Review of Systems  Constitutional: Negative.   HENT: Negative.    Eyes:  Negative.   Respiratory: Negative.    Cardiovascular: Negative.   Gastrointestinal: Negative.   Genitourinary: Negative.   Musculoskeletal: Negative.   Skin: Negative.  Negative for itching and rash.  Neurological: Negative.   Psychiatric/Behavioral: Negative.    All other systems reviewed and are negative.     Objective:     BP 116/82   Pulse (!) 56   Temp 98.7 F (37.1 C)   Ht '5\' 9"'$  (1.753 m)   Wt 204 lb (92.5 kg)   SpO2 97%   BMI 30.13 kg/m  BP Readings from Last 3 Encounters:  10/05/21 116/82  09/27/21 (!) 141/95  03/23/21 135/86   Wt Readings from Last 3 Encounters:  10/05/21 204 lb (92.5 kg)  09/27/21 202 lb (91.6 kg)  03/23/21 219 lb (99.3 kg)      Physical Exam Vitals and nursing  note reviewed.  Constitutional:      Appearance: Normal appearance.  HENT:     Head: Normocephalic.     Right Ear: External ear normal.     Left Ear: External ear normal.     Nose: Nose normal.     Mouth/Throat:     Mouth: Mucous membranes are moist.  Eyes:     Conjunctiva/sclera: Conjunctivae normal.  Cardiovascular:     Rate and Rhythm: Normal rate and regular rhythm.     Pulses: Normal pulses.     Heart sounds: Normal heart sounds.  Pulmonary:     Effort: Pulmonary effort is normal.     Breath sounds: Normal breath sounds.  Abdominal:     General: Bowel sounds are normal.  Musculoskeletal:        General: Normal range of motion.  Skin:    General: Skin is warm.  Neurological:     General: No focal deficit present.     Mental Status: He is alert and oriented to person, place, and time.      No results found for any visits on 10/05/21.  Last CBC Lab Results  Component Value Date   WBC 2.7 (L) 09/27/2021   HGB 14.7 09/27/2021   HCT 43.0 09/27/2021   MCV 88.8 09/27/2021   MCH 30.4 09/27/2021   RDW 13.5 09/27/2021   PLT 252 41/63/8453   Last metabolic panel Lab Results  Component Value Date   GLUCOSE 100 (H) 09/27/2021   NA 138 09/27/2021   K 3.7 09/27/2021   CL 107 09/27/2021   CO2 25 09/27/2021   BUN 13 09/27/2021   CREATININE 0.93 09/27/2021   GFRNONAA >60 09/27/2021   CALCIUM 9.1 09/27/2021   PROT 7.2 03/23/2021   ALBUMIN 4.8 03/23/2021   LABGLOB 2.4 03/23/2021   AGRATIO 2.0 03/23/2021   BILITOT 1.7 (H) 03/23/2021   ALKPHOS 82 03/23/2021   AST 27 03/23/2021   ALT 24 03/23/2021   ANIONGAP 6 09/27/2021   Last lipids Lab Results  Component Value Date   CHOL 166 03/23/2021   HDL 52 03/23/2021   LDLCALC 94 03/23/2021   TRIG 114 03/23/2021   CHOLHDL 3.2 03/23/2021   Last hemoglobin A1c Lab Results  Component Value Date   HGBA1C 4.9 03/19/2019   Last thyroid functions Lab Results  Component Value Date   TSH 1.900 03/19/2019      The  10-year ASCVD risk score (Arnett DK, et al., 2019) is: 9.3%    Assessment & Plan:   Problem List Items Addressed This Visit       Cardiovascular and Mediastinum   Essential  hypertension    Hypertension well-controlled on current medication no changes necessary.  Continue low-sodium diet, exercise and weight loss.  Follow-up in 6 months.        Other   Annual physical exam - Primary    Completed physical exam/head to toe assessment.  Patient has no new concerns.  Education provided and printed handouts given on health maintenance and preventative care.      Relevant Orders   Lipid panel   Comprehensive metabolic panel   CBC with Differential/Platelet   TSH   VITAMIN D 25 Hydroxy (Vit-D Deficiency, Fractures)    Return in about 1 year (around 10/06/2022), or if symptoms worsen or fail to improve, for annual physical.    Ivy Lynn, NP

## 2021-10-06 LAB — COMPREHENSIVE METABOLIC PANEL
ALT: 28 IU/L (ref 0–44)
AST: 28 IU/L (ref 0–40)
Albumin/Globulin Ratio: 2.2 (ref 1.2–2.2)
Albumin: 5 g/dL — ABNORMAL HIGH (ref 3.8–4.9)
Alkaline Phosphatase: 65 IU/L (ref 44–121)
BUN/Creatinine Ratio: 19 (ref 9–20)
BUN: 21 mg/dL (ref 6–24)
Bilirubin Total: 1.4 mg/dL — ABNORMAL HIGH (ref 0.0–1.2)
CO2: 23 mmol/L (ref 20–29)
Calcium: 9.8 mg/dL (ref 8.7–10.2)
Chloride: 103 mmol/L (ref 96–106)
Creatinine, Ser: 1.09 mg/dL (ref 0.76–1.27)
Globulin, Total: 2.3 g/dL (ref 1.5–4.5)
Glucose: 82 mg/dL (ref 70–99)
Potassium: 4.6 mmol/L (ref 3.5–5.2)
Sodium: 141 mmol/L (ref 134–144)
Total Protein: 7.3 g/dL (ref 6.0–8.5)
eGFR: 79 mL/min/{1.73_m2} (ref >59)

## 2021-10-06 LAB — CBC WITH DIFFERENTIAL/PLATELET
Basophils Absolute: 0 10*3/uL (ref 0.0–0.2)
Basos: 1 %
EOS (ABSOLUTE): 0.1 10*3/uL (ref 0.0–0.4)
Eos: 4 %
Hematocrit: 44.7 % (ref 37.5–51.0)
Hemoglobin: 15.2 g/dL (ref 13.0–17.7)
Immature Grans (Abs): 0 10*3/uL (ref 0.0–0.1)
Immature Granulocytes: 0 %
Lymphocytes Absolute: 1 10*3/uL (ref 0.7–3.1)
Lymphs: 34 %
MCH: 30 pg (ref 26.6–33.0)
MCHC: 34 g/dL (ref 31.5–35.7)
MCV: 88 fL (ref 79–97)
Monocytes Absolute: 0.4 10*3/uL (ref 0.1–0.9)
Monocytes: 14 %
Neutrophils Absolute: 1.3 10*3/uL — ABNORMAL LOW (ref 1.4–7.0)
Neutrophils: 47 %
Platelets: 301 10*3/uL (ref 150–450)
RBC: 5.07 x10E6/uL (ref 4.14–5.80)
RDW: 14 % (ref 11.6–15.4)
WBC: 2.8 10*3/uL — ABNORMAL LOW (ref 3.4–10.8)

## 2021-10-06 LAB — LIPID PANEL
Chol/HDL Ratio: 3.5 ratio (ref 0.0–5.0)
Cholesterol, Total: 187 mg/dL (ref 100–199)
HDL: 53 mg/dL (ref >39)
LDL Chol Calc (NIH): 114 mg/dL — ABNORMAL HIGH (ref 0–99)
Triglycerides: 111 mg/dL (ref 0–149)
VLDL Cholesterol Cal: 20 mg/dL (ref 5–40)

## 2021-10-06 LAB — VITAMIN D 25 HYDROXY (VIT D DEFICIENCY, FRACTURES): Vit D, 25-Hydroxy: 16.5 ng/mL — ABNORMAL LOW (ref 30.0–100.0)

## 2021-10-06 LAB — TSH: TSH: 1.84 u[IU]/mL (ref 0.450–4.500)

## 2021-10-11 ENCOUNTER — Telehealth: Payer: Self-pay | Admitting: Nurse Practitioner

## 2021-10-12 ENCOUNTER — Other Ambulatory Visit: Payer: Self-pay | Admitting: Nurse Practitioner

## 2021-10-12 MED ORDER — VITAMIN D3 50 MCG (2000 UT) PO CAPS: 2000.0000 [IU] | ORAL_CAPSULE | Freq: Every day | ORAL | 1 refills | Status: DC

## 2021-10-12 NOTE — Telephone Encounter (Signed)
Labs completed and results notes.  Vitamin D sent to pharmacy [medication is OTC]

## 2021-10-13 NOTE — Telephone Encounter (Signed)
Refer to lab results.  

## 2022-01-31 ENCOUNTER — Other Ambulatory Visit: Payer: Self-pay | Admitting: Nurse Practitioner

## 2022-01-31 DIAGNOSIS — I10 Essential (primary) hypertension: Secondary | ICD-10-CM

## 2022-02-08 ENCOUNTER — Encounter: Payer: Self-pay | Admitting: *Deleted

## 2022-03-08 ENCOUNTER — Ambulatory Visit (INDEPENDENT_AMBULATORY_CARE_PROVIDER_SITE_OTHER): Payer: 59 | Admitting: Family Medicine

## 2022-03-08 ENCOUNTER — Encounter: Payer: Self-pay | Admitting: Family Medicine

## 2022-03-08 ENCOUNTER — Ambulatory Visit: Payer: 59

## 2022-03-08 VITALS — BP 133/83 | HR 62 | Temp 98.4°F | Ht 69.0 in | Wt 211.2 lb

## 2022-03-08 DIAGNOSIS — R1084 Generalized abdominal pain: Secondary | ICD-10-CM | POA: Diagnosis not present

## 2022-03-08 DIAGNOSIS — R0981 Nasal congestion: Secondary | ICD-10-CM | POA: Diagnosis not present

## 2022-03-08 MED ORDER — PANTOPRAZOLE SODIUM 40 MG PO TBEC: 40.0000 mg | DELAYED_RELEASE_TABLET | Freq: Every day | ORAL | 11 refills | Status: DC

## 2022-03-08 NOTE — Progress Notes (Signed)
Subjective:  Patient ID: Matthew Weber, male    DOB: [birth year]  Age: 57 y.o. MRN: [id number]  CC: SNEEZING, Fatigue, and Nasal Congestion   HPI [place] presents for nervous stomach. Using some Tums. They help some. Belching, passing gas.No Vomiting or diarrhea. Appetite is good. Legs are weak. Six mos ago went to ED for fatigue. Now having all the energy in the world.   Sneezing 3-4 times yesterday & 2-3 this AM. No more congestion.      03/08/2022   10:01 AM 10/05/2021    8:11 AM 03/23/2021    8:40 AM  Depression screen PHQ 2/9  Decreased Interest 0 0 0  Down, Depressed, Hopeless 0 0 0  PHQ - 2 Score 0 0 0  Altered sleeping   0  Tired, decreased energy   0  Change in appetite   1  Feeling bad or failure about yourself    0  Trouble concentrating   0  Moving slowly or fidgety/restless   0  Suicidal thoughts   0  PHQ-9 Score   1    History [name] has a past medical history of Hyperlipidemia, Hypertension, and Kidney stones.   He has no past surgical history on file.   His family history includes Diabetes in his mother; Heart attack in his father; Hypertension in his mother.He reports that he has never smoked. He has never used smokeless tobacco. He reports that he does not drink alcohol and does not use drugs.    ROS Review of Systems  Constitutional:  Negative for chills, diaphoresis, fever and unexpected weight change.  HENT:  Negative for rhinorrhea and trouble swallowing.   Respiratory:  Negative for cough, chest tightness and shortness of breath.   Cardiovascular:  Negative for chest pain.  Gastrointestinal:  Positive for abdominal pain. Negative for abdominal distention, blood in stool, constipation, diarrhea, nausea, rectal pain and vomiting.  Genitourinary:  Negative for dysuria, flank pain and hematuria.  Musculoskeletal:  Negative for arthralgias and joint swelling.  Skin:  Negative for rash.  Neurological:  Negative for syncope and headaches.     Objective:  BP 133/83   Pulse 62   Temp 98.4 F (36.9 C)   Ht 5' 9"$  (1.753 m)   Wt 211 lb 3.2 oz (95.8 kg)   SpO2 97%   BMI 31.19 kg/m   BP Readings from Last 3 Encounters:  03/08/22 133/83  10/05/21 116/82  09/27/21 (!) 141/95    Wt Readings from Last 3 Encounters:  03/08/22 211 lb 3.2 oz (95.8 kg)  10/05/21 204 lb (92.5 kg)  09/27/21 202 lb (91.6 kg)     Physical Exam Constitutional:      General: He is not in acute distress.    Appearance: He is well-developed.  HENT:     Head: Normocephalic and atraumatic.     Right Ear: External ear normal.     Left Ear: External ear normal.     Nose: Nose normal.  Eyes:     Conjunctiva/sclera: Conjunctivae normal.     Pupils: Pupils are equal, round, and reactive to light.  Cardiovascular:     Rate and Rhythm: Normal rate and regular rhythm.     Heart sounds: Normal heart sounds. No murmur heard. Pulmonary:     Effort: Pulmonary effort is normal. No respiratory distress.     Breath sounds: Normal breath sounds. No wheezing or rales.  Abdominal:     Palpations: Abdomen is soft.  Tenderness: There is no abdominal tenderness.  Musculoskeletal:        General: Normal range of motion.     Cervical back: Normal range of motion and neck supple.  Skin:    General: Skin is warm and dry.  Neurological:     Mental Status: He is alert and oriented to person, place, and time.     Deep Tendon Reflexes: Reflexes are normal and symmetric.  Psychiatric:        Behavior: Behavior normal.        Thought Content: Thought content normal.        Judgment: Judgment normal.       Assessment & Plan:   Nabeel was seen today for sneezing, fatigue and nasal congestion.  Diagnoses and all orders for this visit:  Nasal congestion       I am having Jama Flavors maintain his aspirin, atorvastatin, Vitamin D3, and amLODipine.  Allergies as of 03/08/2022       Reactions   Ace Inhibitors Swelling        Medication List         Accurate as of March 08, 2022 10:10 AM. If you have any questions, ask your nurse or doctor.          amLODipine 10 MG tablet Commonly known as: NORVASC Take 1 tablet by mouth once daily   aspirin 325 MG tablet Take 325 mg by mouth daily.   atorvastatin 10 MG tablet Commonly known as: Lipitor Take 1 tablet (10 mg total) by mouth daily.   Vitamin D3 50 MCG (2000 UT) Caps Generic drug: Cholecalciferol Take 1 capsule (2,000 Units total) by mouth daily.         Follow-up: No follow-ups on file.  Claretta Fraise, M.D.

## 2022-03-09 LAB — CBC WITH DIFFERENTIAL/PLATELET
Basophils Absolute: 0 10*3/uL (ref 0.0–0.2)
Basos: 1 %
EOS (ABSOLUTE): 0.1 10*3/uL (ref 0.0–0.4)
Eos: 4 %
Hematocrit: 43.9 % (ref 37.5–51.0)
Hemoglobin: 15.2 g/dL (ref 13.0–17.7)
Immature Grans (Abs): 0 10*3/uL (ref 0.0–0.1)
Immature Granulocytes: 0 %
Lymphocytes Absolute: 0.7 10*3/uL (ref 0.7–3.1)
Lymphs: 22 %
MCH: 30.2 pg (ref 26.6–33.0)
MCHC: 34.6 g/dL (ref 31.5–35.7)
MCV: 87 fL (ref 79–97)
Monocytes Absolute: 0.3 10*3/uL (ref 0.1–0.9)
Monocytes: 9 %
Neutrophils Absolute: 2 10*3/uL (ref 1.4–7.0)
Neutrophils: 64 %
Platelets: 313 10*3/uL (ref 150–450)
RBC: 5.03 x10E6/uL (ref 4.14–5.80)
RDW: 12.9 % (ref 11.6–15.4)
WBC: 3.1 10*3/uL — ABNORMAL LOW (ref 3.4–10.8)

## 2022-03-09 LAB — CMP14+EGFR
ALT: 27 IU/L (ref 0–44)
AST: 25 IU/L (ref 0–40)
Albumin/Globulin Ratio: 2.1 (ref 1.2–2.2)
Albumin: 4.9 g/dL (ref 3.8–4.9)
Alkaline Phosphatase: 76 IU/L (ref 44–121)
BUN/Creatinine Ratio: 20 (ref 9–20)
BUN: 19 mg/dL (ref 6–24)
Bilirubin Total: 1.2 mg/dL (ref 0.0–1.2)
CO2: 20 mmol/L (ref 20–29)
Calcium: 10.4 mg/dL — ABNORMAL HIGH (ref 8.7–10.2)
Chloride: 104 mmol/L (ref 96–106)
Creatinine, Ser: 0.95 mg/dL (ref 0.76–1.27)
Globulin, Total: 2.3 g/dL (ref 1.5–4.5)
Glucose: 86 mg/dL (ref 70–99)
Potassium: 4.4 mmol/L (ref 3.5–5.2)
Sodium: 142 mmol/L (ref 134–144)
Total Protein: 7.2 g/dL (ref 6.0–8.5)
eGFR: 93 mL/min/{1.73_m2} (ref >59)

## 2022-03-09 LAB — COVID-19, FLU A+B AND RSV
Influenza A, NAA: NOT DETECTED
Influenza B, NAA: NOT DETECTED
RSV, NAA: NOT DETECTED
SARS-CoV-2, NAA: NOT DETECTED

## 2022-03-09 LAB — LIPASE: Lipase: 38 U/L (ref 13–78)

## 2022-03-09 NOTE — Progress Notes (Signed)
Hello Nekhi,  Your lab result is normal and/or stable.Some minor variations that are not significant are commonly marked abnormal, but do not represent any medical problem for you.  Best regards, Claretta Fraise, M.D.

## 2022-04-07 ENCOUNTER — Ambulatory Visit (INDEPENDENT_AMBULATORY_CARE_PROVIDER_SITE_OTHER): Payer: 59 | Admitting: Family Medicine

## 2022-04-07 ENCOUNTER — Encounter: Payer: Self-pay | Admitting: Family Medicine

## 2022-04-07 VITALS — BP 141/93 | HR 60 | Temp 97.6°F | Ht 69.0 in | Wt 213.2 lb

## 2022-04-07 DIAGNOSIS — K219 Gastro-esophageal reflux disease without esophagitis: Secondary | ICD-10-CM

## 2022-04-07 DIAGNOSIS — R0981 Nasal congestion: Secondary | ICD-10-CM | POA: Diagnosis not present

## 2022-04-07 MED ORDER — FLUTICASONE PROPIONATE 50 MCG/ACT NA SUSP: 2.0000 | Freq: Every day | NASAL | 6 refills | Status: DC

## 2022-04-07 NOTE — Progress Notes (Signed)
Subjective:  Patient ID: Matthew Weber, male    DOB: 12/26/1965, 57 y.o.   MRN: [id number]  Patient Care Team: Baruch Gouty, FNP as PCP - General (Family Medicine)   Chief Complaint:  Abdominal Pain (X 1 month follow up)   HPI: Matthew Weber is a 57 y.o. male presenting on 04/07/2022 for Abdominal Pain (X 1 month follow up)   Pt presents today for reevaluation of abdominal pain after starting PPI therapy and for continued rhinorrhea and nasal congestion. Has been taking medications as prescribed. Abdominal pain has resolved. Rhinorrhea and congestion still present.   Abdominal Pain This is a recurrent problem. The current episode started more than 1 month ago. The problem occurs intermittently. The problem has been resolved. The pain is located in the generalized abdominal region and epigastric region. The quality of the pain is burning and aching. The abdominal pain does not radiate. Pertinent negatives include no anorexia, arthralgias, belching, constipation, diarrhea, dysuria, fever, flatus, frequency, headaches, hematochezia, hematuria, melena, myalgias, nausea, vomiting or weight loss. The pain is aggravated by certain positions and eating. He has tried proton pump inhibitors for the symptoms. The treatment provided significant relief.    Relevant past medical, surgical, family, and social history reviewed and updated as indicated.  Allergies and medications reviewed and updated. Data reviewed: Chart in Epic.   Past Medical History:  Diagnosis Date   Hyperlipidemia    Hypertension    Kidney stones     History reviewed. No pertinent surgical history.  Social History   Socioeconomic History   Marital status: Married    Spouse name: Not on file   Number of children: 2   Years of education: Not on file   Highest education level: Not on file  Occupational History   Not on file  Tobacco Use   Smoking status: Never   Smokeless tobacco: Never  Vaping Use   Vaping Use:  Never used  Substance and Sexual Activity   Alcohol use: No   Drug use: No   Sexual activity: Not on file  Other Topics Concern   Not on file  Social History Narrative   Not on file   Social Determinants of Health   Financial Resource Strain: Not on file  Food Insecurity: Not on file  Transportation Needs: Not on file  Physical Activity: Not on file  Stress: Not on file  Social Connections: Not on file  Intimate Partner Violence: Not on file    Outpatient Encounter Medications as of 04/07/2022  Medication Sig   amLODipine (NORVASC) 10 MG tablet Take 1 tablet by mouth once daily   aspirin 325 MG tablet Take 325 mg by mouth daily.   atorvastatin (LIPITOR) 10 MG tablet Take 1 tablet (10 mg total) by mouth daily.   Cholecalciferol (VITAMIN D3) 50 MCG (2000 UT) capsule Take 1 capsule (2,000 Units total) by mouth daily.   fluticasone (FLONASE) 50 MCG/ACT nasal spray Place 2 sprays into both nostrils daily.   pantoprazole (PROTONIX) 40 MG tablet Take 1 tablet (40 mg total) by mouth daily. For stomach   No facility-administered encounter medications on file as of 04/07/2022.    Allergies  Allergen Reactions   Ace Inhibitors Swelling    Review of Systems  Constitutional:  Negative for activity change, appetite change, chills, diaphoresis, fatigue, fever, unexpected weight change and weight loss.  HENT:  Positive for congestion, postnasal drip, rhinorrhea and sneezing. Negative for dental problem, drooling, ear discharge, ear pain, facial  swelling, hearing loss, mouth sores, nosebleeds, sinus pressure, sinus pain, sore throat, tinnitus, trouble swallowing and voice change.   Eyes: Negative.  Negative for photophobia and visual disturbance.  Respiratory:  Negative for cough, chest tightness and shortness of breath.   Cardiovascular:  Negative for chest pain, palpitations and leg swelling.  Gastrointestinal:  Positive for abdominal pain. Negative for abdominal distention, anal bleeding,  anorexia, blood in stool, constipation, diarrhea, flatus, hematochezia, melena, nausea, rectal pain and vomiting.  Endocrine: Negative.  Negative for polydipsia, polyphagia and polyuria.  Genitourinary:  Negative for decreased urine volume, difficulty urinating, dysuria, frequency, hematuria and urgency.  Musculoskeletal:  Negative for arthralgias and myalgias.  Skin: Negative.   Allergic/Immunologic: Negative.   Neurological:  Negative for dizziness, tremors, seizures, syncope, facial asymmetry, speech difficulty, weakness, light-headedness, numbness and headaches.  Hematological: Negative.   Psychiatric/Behavioral:  Negative for confusion, hallucinations, sleep disturbance and suicidal ideas.   All other systems reviewed and are negative.       Objective:  BP (!) 141/93   Pulse 60   Temp 97.6 F (36.4 C) (Temporal)   Ht '5\' 9"'$  (1.753 m)   Wt 213 lb 3.2 oz (96.7 kg)   SpO2 99%   BMI 31.48 kg/m    Wt Readings from Last 3 Encounters:  04/07/22 213 lb 3.2 oz (96.7 kg)  03/08/22 211 lb 3.2 oz (95.8 kg)  10/05/21 204 lb (92.5 kg)    Physical Exam Vitals and nursing note reviewed.  Constitutional:      General: He is not in acute distress.    Appearance: Normal appearance. He is well-developed and well-groomed. He is obese. He is not ill-appearing, toxic-appearing or diaphoretic.  HENT:     Head: Normocephalic and atraumatic.     Jaw: There is normal jaw occlusion.     Right Ear: Hearing normal. A middle ear effusion is present. Tympanic membrane is not erythematous.     Left Ear: Hearing normal. A middle ear effusion is present. Tympanic membrane is not erythematous.     Nose: Congestion present.     Right Turbinates: Swollen.     Left Turbinates: Swollen.     Mouth/Throat:     Lips: Pink.     Mouth: Mucous membranes are moist.     Pharynx: Oropharynx is clear. Uvula midline.     Comments: Cobblestoning of posterior oropharynx.  Eyes:     General: Lids are normal.      Extraocular Movements: Extraocular movements intact.     Conjunctiva/sclera: Conjunctivae normal.     Pupils: Pupils are equal, round, and reactive to light.  Neck:     Thyroid: No thyroid mass, thyromegaly or thyroid tenderness.     Vascular: No carotid bruit or JVD.     Trachea: Trachea and phonation normal.  Cardiovascular:     Rate and Rhythm: Normal rate and regular rhythm.     Chest Wall: PMI is not displaced.     Pulses: Normal pulses.     Heart sounds: Normal heart sounds. No murmur heard.    No friction rub. No gallop.  Pulmonary:     Effort: Pulmonary effort is normal. No respiratory distress.     Breath sounds: Normal breath sounds. No wheezing.  Abdominal:     General: Bowel sounds are normal. There is no distension or abdominal bruit.     Palpations: Abdomen is soft. There is no hepatomegaly or splenomegaly.     Tenderness: There is no abdominal tenderness. There is  no right CVA tenderness or left CVA tenderness.     Hernia: No hernia is present.  Musculoskeletal:        General: Normal range of motion.     Cervical back: Normal range of motion and neck supple.     Right lower leg: No edema.     Left lower leg: No edema.  Lymphadenopathy:     Cervical: No cervical adenopathy.  Skin:    General: Skin is warm and dry.     Capillary Refill: Capillary refill takes less than 2 seconds.     Coloration: Skin is not cyanotic, jaundiced or pale.     Findings: No rash.  Neurological:     General: No focal deficit present.     Mental Status: He is alert and oriented to person, place, and time.     Sensory: Sensation is intact.     Motor: Motor function is intact.     Coordination: Coordination is intact.     Gait: Gait is intact.     Deep Tendon Reflexes: Reflexes are normal and symmetric.  Psychiatric:        Attention and Perception: Attention and perception normal.        Mood and Affect: Mood and affect normal.        Speech: Speech normal.        Behavior: Behavior  normal. Behavior is cooperative.        Thought Content: Thought content normal.        Cognition and Memory: Cognition and memory normal.        Judgment: Judgment normal.     Results for orders placed or performed in visit on 03/08/22  COVID-19, Flu A+B and RSV   Specimen: Nasopharyngeal(NP) swabs in vial transport medium  Result Value Ref Range   SARS-CoV-2, NAA Not Detected Not Detected   Influenza A, NAA Not Detected Not Detected   Influenza B, NAA Not Detected Not Detected   RSV, NAA Not Detected Not Detected   Test Information: Comment   CBC with Differential/Platelet  Result Value Ref Range   WBC 3.1 (L) 3.4 - 10.8 x10E3/uL   RBC 5.03 4.14 - 5.80 x10E6/uL   Hemoglobin 15.2 13.0 - 17.7 g/dL   Hematocrit 43.9 37.5 - 51.0 %   MCV 87 79 - 97 fL   MCH 30.2 26.6 - 33.0 pg   MCHC 34.6 31.5 - 35.7 g/dL   RDW 12.9 11.6 - 15.4 %   Platelets 313 150 - 450 x10E3/uL   Neutrophils 64 Not Estab. %   Lymphs 22 Not Estab. %   Monocytes 9 Not Estab. %   Eos 4 Not Estab. %   Basos 1 Not Estab. %   Neutrophils Absolute 2.0 1.4 - 7.0 x10E3/uL   Lymphocytes Absolute 0.7 0.7 - 3.1 x10E3/uL   Monocytes Absolute 0.3 0.1 - 0.9 x10E3/uL   EOS (ABSOLUTE) 0.1 0.0 - 0.4 x10E3/uL   Basophils Absolute 0.0 0.0 - 0.2 x10E3/uL   Immature Granulocytes 0 Not Estab. %   Immature Grans (Abs) 0.0 0.0 - 0.1 x10E3/uL  CMP14+EGFR  Result Value Ref Range   Glucose 86 70 - 99 mg/dL   BUN 19 6 - 24 mg/dL   Creatinine, Ser 0.95 0.76 - 1.27 mg/dL   eGFR 93 >59 mL/min/1.73   BUN/Creatinine Ratio 20 9 - 20   Sodium 142 134 - 144 mmol/L   Potassium 4.4 3.5 - 5.2 mmol/L   Chloride 104 96 - 106  mmol/L   CO2 20 20 - 29 mmol/L   Calcium 10.4 (H) 8.7 - 10.2 mg/dL   Total Protein 7.2 6.0 - 8.5 g/dL   Albumin 4.9 3.8 - 4.9 g/dL   Globulin, Total 2.3 1.5 - 4.5 g/dL   Albumin/Globulin Ratio 2.1 1.2 - 2.2   Bilirubin Total 1.2 0.0 - 1.2 mg/dL   Alkaline Phosphatase 76 44 - 121 IU/L   AST 25 0 - 40 IU/L   ALT 27  0 - 44 IU/L  Lipase  Result Value Ref Range   Lipase 38 13 - 78 U/L       Pertinent labs & imaging results that were available during my care of the patient were reviewed by me and considered in my medical decision making.  Assessment & Plan:  Ulis was seen today for abdominal pain.  Diagnoses and all orders for this visit:  Gastroesophageal reflux disease without esophagitis Doing well on PPI therapy. Discussed tapering off to see if symptoms return. If they have subsided, can discontinue therapy. Report new, worsening, or persistent symptoms.   Nasal congestion Allergic rhinitis symptoms. Will trial below. Pt aware to report new, worsening, or persistent symptoms.  -     fluticasone (FLONASE) 50 MCG/ACT nasal spray; Place 2 sprays into both nostrils daily.     Continue all other maintenance medications.  Follow up plan: Return if symptoms worsen or fail to improve.   Continue healthy lifestyle choices, including diet (rich in fruits, vegetables, and lean proteins, and low in salt and simple carbohydrates) and exercise (at least 30 minutes of moderate physical activity daily).  Educational handout given for GERD, allergic rhinitis   The above assessment and management plan was discussed with the patient. The patient verbalized understanding of and has agreed to the management plan. Patient is aware to call the clinic if they develop any new symptoms or if symptoms persist or worsen. Patient is aware when to return to the clinic for a follow-up visit. Patient educated on when it is appropriate to go to the emergency department.   Monia Pouch, FNP-C [facility] [phone]

## 2022-06-13 ENCOUNTER — Telehealth: Payer: Self-pay | Admitting: *Deleted

## 2022-06-13 NOTE — Telephone Encounter (Signed)
Has had a migraine but feels like it is easing off, feels like he is getting congested but he took a COVID test which was negative, has not been taking his allergy medication recently. Will start back on his allergy medicine, see how he is feeling rest of day & will call back in am. Offered Video visit for tomorrow afternoon but not sure he is able to do video or he will go to urgent care if he is not feeling any better. BP is running about 140s/90.

## 2022-06-15 ENCOUNTER — Encounter: Payer: Self-pay | Admitting: Family Medicine

## 2022-06-15 ENCOUNTER — Ambulatory Visit (INDEPENDENT_AMBULATORY_CARE_PROVIDER_SITE_OTHER): Payer: 59 | Admitting: Family Medicine

## 2022-06-15 VITALS — BP 136/89 | HR 87 | Temp 98.5°F | Ht 69.0 in | Wt 205.2 lb

## 2022-06-15 DIAGNOSIS — J0140 Acute pansinusitis, unspecified: Secondary | ICD-10-CM | POA: Diagnosis not present

## 2022-06-15 MED ORDER — DOXYCYCLINE HYCLATE 100 MG PO TABS: 100.0000 mg | ORAL_TABLET | Freq: Two times a day (BID) | ORAL | 0 refills | Status: AC

## 2022-06-15 MED ORDER — PREDNISONE 20 MG PO TABS: 40.0000 mg | ORAL_TABLET | Freq: Every day | ORAL | 0 refills | Status: AC

## 2022-06-15 MED ORDER — GUAIFENESIN ER 600 MG PO TB12: 600.0000 mg | ORAL_TABLET | Freq: Two times a day (BID) | ORAL | 0 refills | Status: AC

## 2022-06-15 NOTE — Progress Notes (Signed)
Subjective:  Patient ID: Matthew Weber, male    DOB: [birth year], 57 y.o.   MRN: [id number]  Patient Care Team: Sonny Masters, FNP as PCP - General (Family Medicine)   Chief Complaint:  Headache   HPI: Matthew Weber is a 57 y.o. male presenting on 06/15/2022 for Headache   Headache  This is a new problem. Episode onset: Sunday. The problem occurs constantly. The problem has been waxing and waning. The pain is located in the Bilateral and frontal region. The quality of the pain is described as aching and dull (pressure). The pain is moderate. Associated symptoms include coughing, drainage, rhinorrhea and sinus pressure. Pertinent negatives include no abdominal pain, abnormal behavior, anorexia, back pain, blurred vision, dizziness, ear pain, eye pain, eye redness, eye watering, facial sweating, fever, hearing loss, insomnia, loss of balance, muscle aches, nausea, neck pain, numbness, phonophobia, photophobia, scalp tenderness, seizures, sore throat, swollen glands, tingling, tinnitus, visual change, vomiting, weakness or weight loss. Nothing aggravates the symptoms. Treatments tried: OTC cold medication. The treatment provided mild relief.     Relevant past medical, surgical, family, and social history reviewed and updated as indicated.  Allergies and medications reviewed and updated. Data reviewed: Chart in Epic.   Past Medical History:  Diagnosis Date   Hyperlipidemia    Hypertension    Kidney stones     History reviewed. No pertinent surgical history.  Social History   Socioeconomic History   Marital status: Married    Spouse name: Not on file   Number of children: 2   Years of education: Not on file   Highest education level: Not on file  Occupational History   Not on file  Tobacco Use   Smoking status: Never   Smokeless tobacco: Never  Vaping Use   Vaping Use: Never used  Substance and Sexual Activity   Alcohol use: No   Drug use: No   Sexual activity: Not on file   Other Topics Concern   Not on file  Social History Narrative   Not on file   Social Determinants of Health   Financial Resource Strain: Not on file  Food Insecurity: Not on file  Transportation Needs: Not on file  Physical Activity: Not on file  Stress: Not on file  Social Connections: Not on file  Intimate Partner Violence: Not on file    Outpatient Encounter Medications as of 06/15/2022  Medication Sig   amLODipine (NORVASC) 10 MG tablet Take 1 tablet by mouth once daily   atorvastatin (LIPITOR) 10 MG tablet Take 1 tablet (10 mg total) by mouth daily.   Cholecalciferol (VITAMIN D3) 50 MCG (2000 UT) capsule Take 1 capsule (2,000 Units total) by mouth daily.   doxycycline (VIBRA-TABS) 100 MG tablet Take 1 tablet (100 mg total) by mouth 2 (two) times daily for 10 days. 1 po bid   fluticasone (FLONASE) 50 MCG/ACT nasal spray Place 2 sprays into both nostrils daily.   guaiFENesin (MUCINEX) 600 MG 12 hr tablet Take 1 tablet (600 mg total) by mouth 2 (two) times daily for 10 days.   pantoprazole (PROTONIX) 40 MG tablet Take 1 tablet (40 mg total) by mouth daily. For stomach   predniSONE (DELTASONE) 20 MG tablet Take 2 tablets (40 mg total) by mouth daily with breakfast for 5 days.   [DISCONTINUED] aspirin 325 MG tablet Take 325 mg by mouth daily. (Patient not taking: Reported on 06/15/2022)   No facility-administered encounter medications on file as of 06/15/2022.  Allergies  Allergen Reactions   Ace Inhibitors Swelling    Review of Systems  Constitutional:  Negative for activity change, appetite change, chills, diaphoresis, fatigue, fever, unexpected weight change and weight loss.  HENT:  Positive for congestion, postnasal drip, rhinorrhea and sinus pressure. Negative for dental problem, drooling, ear discharge, ear pain, facial swelling, hearing loss, mouth sores, nosebleeds, sinus pain, sneezing, sore throat, tinnitus, trouble swallowing and voice change.   Eyes: Negative.   Negative for blurred vision, photophobia, pain, discharge, redness, itching and visual disturbance.  Respiratory:  Positive for cough. Negative for apnea, choking, chest tightness, shortness of breath, wheezing and stridor.   Cardiovascular:  Negative for chest pain, palpitations and leg swelling.  Gastrointestinal:  Negative for abdominal pain, anorexia, blood in stool, constipation, diarrhea, nausea and vomiting.  Endocrine: Negative.   Genitourinary:  Negative for decreased urine volume, difficulty urinating, dysuria, frequency and urgency.  Musculoskeletal:  Negative for arthralgias, back pain, myalgias and neck pain.  Skin: Negative.   Allergic/Immunologic: Negative.   Neurological:  Negative for dizziness, tingling, tremors, seizures, syncope, speech difficulty, weakness, numbness, headaches and loss of balance.  Hematological: Negative.   Psychiatric/Behavioral:  Negative for confusion, hallucinations, sleep disturbance and suicidal ideas. The patient does not have insomnia.   All other systems reviewed and are negative.       Objective:  BP 136/89   Pulse 87   Temp 98.5 F (36.9 C) (Temporal)   Ht 5\' 9"  (1.753 m)   Wt 205 lb 3.2 oz (93.1 kg)   SpO2 94%   BMI 30.30 kg/m    Wt Readings from Last 3 Encounters:  06/15/22 205 lb 3.2 oz (93.1 kg)  04/07/22 213 lb 3.2 oz (96.7 kg)  03/08/22 211 lb 3.2 oz (95.8 kg)    Physical Exam Vitals and nursing note reviewed.  Constitutional:      General: He is not in acute distress.    Appearance: Normal appearance. He is well-developed and well-groomed. He is obese. He is not ill-appearing, toxic-appearing or diaphoretic.  HENT:     Head: Normocephalic and atraumatic.     Jaw: There is normal jaw occlusion.     Right Ear: Hearing, ear canal and external ear normal. A middle ear effusion is present. Tympanic membrane is not erythematous.     Left Ear: Hearing, ear canal and external ear normal. A middle ear effusion is present.  Tympanic membrane is not erythematous.     Nose: Congestion present.     Right Turbinates: Enlarged.     Left Turbinates: Enlarged.     Right Sinus: Maxillary sinus tenderness and frontal sinus tenderness present.     Left Sinus: Maxillary sinus tenderness and frontal sinus tenderness present.     Mouth/Throat:     Lips: Pink.     Mouth: Mucous membranes are moist.     Pharynx: Oropharynx is clear. Uvula midline. Posterior oropharyngeal erythema present. No oropharyngeal exudate.     Comments: Postnasal drainage, cobblestoning Eyes:     General: Lids are normal.     Extraocular Movements: Extraocular movements intact.     Conjunctiva/sclera: Conjunctivae normal.     Pupils: Pupils are equal, round, and reactive to light.  Neck:     Thyroid: No thyroid mass, thyromegaly or thyroid tenderness.     Vascular: No carotid bruit or JVD.     Trachea: Trachea and phonation normal.  Cardiovascular:     Rate and Rhythm: Normal rate and regular rhythm.  Chest Wall: PMI is not displaced.     Pulses: Normal pulses.     Heart sounds: Normal heart sounds. No murmur heard.    No friction rub. No gallop.  Pulmonary:     Effort: Pulmonary effort is normal. No respiratory distress.     Breath sounds: Normal breath sounds. No wheezing.  Abdominal:     General: There is no abdominal bruit.     Palpations: There is no hepatomegaly or splenomegaly.  Musculoskeletal:        General: Normal range of motion.     Cervical back: Normal range of motion and neck supple.     Right lower leg: No edema.     Left lower leg: No edema.  Lymphadenopathy:     Cervical: No cervical adenopathy.  Skin:    General: Skin is warm and dry.     Capillary Refill: Capillary refill takes less than 2 seconds.     Coloration: Skin is not cyanotic, jaundiced or pale.     Findings: No rash.  Neurological:     General: No focal deficit present.     Mental Status: He is alert and oriented to person, place, and time.      Sensory: Sensation is intact.     Motor: Motor function is intact.     Coordination: Coordination is intact.     Gait: Gait is intact.     Deep Tendon Reflexes: Reflexes are normal and symmetric.  Psychiatric:        Attention and Perception: Attention and perception normal.        Mood and Affect: Mood and affect normal.        Speech: Speech normal.        Behavior: Behavior normal. Behavior is cooperative.        Thought Content: Thought content normal.        Cognition and Memory: Cognition and memory normal.        Judgment: Judgment normal.     Results for orders placed or performed in visit on 03/08/22  COVID-19, Flu A+B and RSV   Specimen: Nasopharyngeal(NP) swabs in vial transport medium  Result Value Ref Range   SARS-CoV-2, NAA Not Detected Not Detected   Influenza A, NAA Not Detected Not Detected   Influenza B, NAA Not Detected Not Detected   RSV, NAA Not Detected Not Detected   Test Information: Comment   CBC with Differential/Platelet  Result Value Ref Range   WBC 3.1 (L) 3.4 - 10.8 x10E3/uL   RBC 5.03 4.14 - 5.80 x10E6/uL   Hemoglobin 15.2 13.0 - 17.7 g/dL   Hematocrit 16.1 09.6 - 51.0 %   MCV 87 79 - 97 fL   MCH 30.2 26.6 - 33.0 pg   MCHC 34.6 31.5 - 35.7 g/dL   RDW 04.5 40.9 - 81.1 %   Platelets 313 150 - 450 x10E3/uL   Neutrophils 64 Not Estab. %   Lymphs 22 Not Estab. %   Monocytes 9 Not Estab. %   Eos 4 Not Estab. %   Basos 1 Not Estab. %   Neutrophils Absolute 2.0 1.4 - 7.0 x10E3/uL   Lymphocytes Absolute 0.7 0.7 - 3.1 x10E3/uL   Monocytes Absolute 0.3 0.1 - 0.9 x10E3/uL   EOS (ABSOLUTE) 0.1 0.0 - 0.4 x10E3/uL   Basophils Absolute 0.0 0.0 - 0.2 x10E3/uL   Immature Granulocytes 0 Not Estab. %   Immature Grans (Abs) 0.0 0.0 - 0.1 x10E3/uL  CMP14+EGFR  Result Value  Ref Range   Glucose 86 70 - 99 mg/dL   BUN 19 6 - 24 mg/dL   Creatinine, Ser 0.96 0.76 - 1.27 mg/dL   eGFR 93 >04 VW/UJW/1.19   BUN/Creatinine Ratio 20 9 - 20   Sodium 142 134 - 144  mmol/L   Potassium 4.4 3.5 - 5.2 mmol/L   Chloride 104 96 - 106 mmol/L   CO2 20 20 - 29 mmol/L   Calcium 10.4 (H) 8.7 - 10.2 mg/dL   Total Protein 7.2 6.0 - 8.5 g/dL   Albumin 4.9 3.8 - 4.9 g/dL   Globulin, Total 2.3 1.5 - 4.5 g/dL   Albumin/Globulin Ratio 2.1 1.2 - 2.2   Bilirubin Total 1.2 0.0 - 1.2 mg/dL   Alkaline Phosphatase 76 44 - 121 IU/L   AST 25 0 - 40 IU/L   ALT 27 0 - 44 IU/L  Lipase  Result Value Ref Range   Lipase 38 13 - 78 U/L       Pertinent labs & imaging results that were available during my care of the patient were reviewed by me and considered in my medical decision making.  Assessment & Plan:  Rudolphe was seen today for headache.  Diagnoses and all orders for this visit:  Acute non-recurrent pansinusitis Classic sinusitis, will treat with below. Aware to start using Flonase daily. Report new, worsening, or persistent symptoms.  -     doxycycline (VIBRA-TABS) 100 MG tablet; Take 1 tablet (100 mg total) by mouth 2 (two) times daily for 10 days. 1 po bid -     guaiFENesin (MUCINEX) 600 MG 12 hr tablet; Take 1 tablet (600 mg total) by mouth 2 (two) times daily for 10 days. -     predniSONE (DELTASONE) 20 MG tablet; Take 2 tablets (40 mg total) by mouth daily with breakfast for 5 days.     Continue all other maintenance medications.  Follow up plan: Return if symptoms worsen or fail to improve.   Continue healthy lifestyle choices, including diet (rich in fruits, vegetables, and lean proteins, and low in salt and simple carbohydrates) and exercise (at least 30 minutes of moderate physical activity daily).  Educational handout given for sinusitis  The above assessment and management plan was discussed with the patient. The patient verbalized understanding of and has agreed to the management plan. Patient is aware to call the clinic if they develop any new symptoms or if symptoms persist or worsen. Patient is aware when to return to the clinic for a follow-up  visit. Patient educated on when it is appropriate to go to the emergency department.   Kari Baars, FNP-C [facility] [phone]

## 2022-10-07 ENCOUNTER — Encounter: Payer: Self-pay | Admitting: Family Medicine

## 2022-10-07 ENCOUNTER — Encounter: Payer: 59 | Admitting: Nurse Practitioner

## 2022-10-07 ENCOUNTER — Ambulatory Visit (INDEPENDENT_AMBULATORY_CARE_PROVIDER_SITE_OTHER): Payer: 59 | Admitting: Family Medicine

## 2022-10-07 VITALS — BP 143/88 | HR 61 | Temp 97.8°F | Ht 69.0 in | Wt 215.8 lb

## 2022-10-07 DIAGNOSIS — E559 Vitamin D deficiency, unspecified: Secondary | ICD-10-CM

## 2022-10-07 DIAGNOSIS — E782 Mixed hyperlipidemia: Secondary | ICD-10-CM

## 2022-10-07 DIAGNOSIS — Z125 Encounter for screening for malignant neoplasm of prostate: Secondary | ICD-10-CM

## 2022-10-07 DIAGNOSIS — Z1159 Encounter for screening for other viral diseases: Secondary | ICD-10-CM

## 2022-10-07 DIAGNOSIS — I10 Essential (primary) hypertension: Secondary | ICD-10-CM | POA: Diagnosis not present

## 2022-10-07 DIAGNOSIS — Z0001 Encounter for general adult medical examination with abnormal findings: Secondary | ICD-10-CM

## 2022-10-07 DIAGNOSIS — Z0000 Encounter for general adult medical examination without abnormal findings: Secondary | ICD-10-CM

## 2022-10-07 DIAGNOSIS — Z131 Encounter for screening for diabetes mellitus: Secondary | ICD-10-CM

## 2022-10-07 DIAGNOSIS — K219 Gastro-esophageal reflux disease without esophagitis: Secondary | ICD-10-CM

## 2022-10-07 LAB — BAYER DCA HB A1C WAIVED: HB A1C (BAYER DCA - WAIVED): 5.3 % (ref 4.8–5.6)

## 2022-10-07 MED ORDER — ATORVASTATIN CALCIUM 10 MG PO TABS: 10.0000 mg | ORAL_TABLET | Freq: Every day | ORAL | 1 refills | Status: DC

## 2022-10-07 MED ORDER — VITAMIN D3 50 MCG (2000 UT) PO CAPS: 2000.0000 [IU] | ORAL_CAPSULE | Freq: Every day | ORAL | 1 refills | Status: DC

## 2022-10-07 MED ORDER — AMLODIPINE BESYLATE 10 MG PO TABS: 10.0000 mg | ORAL_TABLET | Freq: Every day | ORAL | 1 refills | Status: DC

## 2022-10-07 MED ORDER — PANTOPRAZOLE SODIUM 40 MG PO TBEC: 40.0000 mg | DELAYED_RELEASE_TABLET | Freq: Every day | ORAL | 1 refills | Status: DC

## 2022-10-07 NOTE — Progress Notes (Signed)
Complete physical exam  Patient: Matthew Weber   DOB: [birth year]   57 y.o. Male  MRN: [id number]  Subjective:    Chief Complaint  Patient presents with   Establish Care    Previous JE patient    Annual Exam    Matthew Weber is a 57 y.o. male who presents today for a complete physical exam. He reports consuming a general diet. The patient has a physically strenuous job, but has no regular exercise apart from work.  He generally feels well. He reports sleeping well. He does not have additional problems to discuss today.    Most recent fall risk assessment:    10/07/2022    8:29 AM  Fall Risk   Falls in the past year? 0     Most recent depression screenings:    10/07/2022    8:30 AM 06/15/2022    8:01 AM  PHQ 2/9 Scores  PHQ - 2 Score 0 0  PHQ- 9 Score 1 3    Vision:Within last year and Dental: No current dental problems and Receives regular dental care  Patient Active Problem List   Diagnosis Date Noted   Gastroesophageal reflux disease without esophagitis 03/23/2020   Hyperlipidemia 03/08/2018   Essential hypertension 08/17/2016   Allergic rhinitis due to pollen 08/17/2016   Lipoma of neck 08/09/2016   Past Medical History:  Diagnosis Date   Hyperlipidemia    Hypertension    Kidney stones    History reviewed. No pertinent surgical history. Social History   Tobacco Use   Smoking status: Never   Smokeless tobacco: Never  Vaping Use   Vaping status: Never Used  Substance Use Topics   Alcohol use: No   Drug use: No   Social History   Socioeconomic History   Marital status: Married    Spouse name: Not on file   Number of children: 2   Years of education: Not on file   Highest education level: Not on file  Occupational History   Not on file  Tobacco Use   Smoking status: Never   Smokeless tobacco: Never  Vaping Use   Vaping status: Never Used  Substance and Sexual Activity   Alcohol use: No   Drug use: No   Sexual activity: Not on file  Other  Topics Concern   Not on file  Social History Narrative   Not on file   Social Determinants of Health   Financial Resource Strain: Not on file  Food Insecurity: Not on file  Transportation Needs: Not on file  Physical Activity: Not on file  Stress: Not on file  Social Connections: Not on file  Intimate Partner Violence: Not on file   Family Status  Relation Name Status   Mother  Alive   Father  Deceased   Sister  Alive   Brother  Alive   Daughter  Alive   Son  Alive   Sister  Alive   Sister  Alive   Sister Half sisiter Alive   Brother  Alive  No partnership data on file   Family History  Problem Relation Age of Onset   Diabetes Mother    Hypertension Mother    Heart attack Father    Allergies  Allergen Reactions   Ace Inhibitors Swelling      Patient Care Team: Sonny Masters, FNP as PCP - General (Family Medicine)   Outpatient Medications Prior to Visit  Medication Sig   fluticasone (FLONASE) 50 MCG/ACT nasal spray Place  2 sprays into both nostrils daily.   [DISCONTINUED] amLODipine (NORVASC) 10 MG tablet Take 1 tablet by mouth once daily   [DISCONTINUED] atorvastatin (LIPITOR) 10 MG tablet Take 1 tablet (10 mg total) by mouth daily.   [DISCONTINUED] Cholecalciferol (VITAMIN D3) 50 MCG (2000 UT) capsule Take 1 capsule (2,000 Units total) by mouth daily.   [DISCONTINUED] pantoprazole (PROTONIX) 40 MG tablet Take 1 tablet (40 mg total) by mouth daily. For stomach   No facility-administered medications prior to visit.    Review of Systems  Genitourinary:  Negative for dysuria, flank pain, frequency, hematuria and urgency.       Nocturia, renal stones  All other systems reviewed and are negative.       Objective:     BP (!) 143/88   Pulse 61   Temp 97.8 F (36.6 C) (Temporal)   Ht 5\' 9"  (1.753 m)   Wt 215 lb 12.8 oz (97.9 kg)   SpO2 96%   BMI 31.87 kg/m  BP Readings from Last 3 Encounters:  10/07/22 (!) 143/88  06/15/22 136/89  04/07/22 (!)  141/93   Wt Readings from Last 3 Encounters:  10/07/22 215 lb 12.8 oz (97.9 kg)  06/15/22 205 lb 3.2 oz (93.1 kg)  04/07/22 213 lb 3.2 oz (96.7 kg)   SpO2 Readings from Last 3 Encounters:  10/07/22 96%  06/15/22 94%  04/07/22 99%      Physical Exam Vitals and nursing note reviewed.  Constitutional:      General: He is not in acute distress.    Appearance: Normal appearance. He is well-developed and well-groomed. He is obese. He is not ill-appearing, toxic-appearing or diaphoretic.  HENT:     Head: Normocephalic and atraumatic.     Jaw: There is normal jaw occlusion.     Right Ear: Hearing, tympanic membrane, ear canal and external ear normal.     Left Ear: Hearing, tympanic membrane, ear canal and external ear normal.     Nose: Nose normal.     Mouth/Throat:     Lips: Pink.     Mouth: Mucous membranes are moist.     Pharynx: Oropharynx is clear. Uvula midline.  Eyes:     General: Lids are normal.     Extraocular Movements: Extraocular movements intact.     Conjunctiva/sclera: Conjunctivae normal.     Pupils: Pupils are equal, round, and reactive to light.  Neck:     Thyroid: No thyroid mass, thyromegaly or thyroid tenderness.     Vascular: No carotid bruit or JVD.     Trachea: Trachea and phonation normal.  Cardiovascular:     Rate and Rhythm: Normal rate and regular rhythm.     Chest Wall: PMI is not displaced.     Pulses: Normal pulses.     Heart sounds: Normal heart sounds. No murmur heard.    No friction rub. No gallop.  Pulmonary:     Effort: Pulmonary effort is normal. No respiratory distress.     Breath sounds: Normal breath sounds. No wheezing.  Abdominal:     General: Bowel sounds are normal. There is no distension or abdominal bruit.     Palpations: Abdomen is soft. There is no hepatomegaly or splenomegaly.     Tenderness: There is no abdominal tenderness. There is no right CVA tenderness or left CVA tenderness.     Hernia: No hernia is present.   Musculoskeletal:        General: Normal range of motion.  Cervical back: Normal range of motion and neck supple.     Right lower leg: No edema.     Left lower leg: No edema.  Lymphadenopathy:     Cervical: No cervical adenopathy.  Skin:    General: Skin is warm and dry.     Capillary Refill: Capillary refill takes less than 2 seconds.     Coloration: Skin is not cyanotic, jaundiced or pale.     Findings: No rash.  Neurological:     General: No focal deficit present.     Mental Status: He is alert and oriented to person, place, and time.     Sensory: Sensation is intact.     Motor: Motor function is intact.     Coordination: Coordination is intact.     Gait: Gait is intact.     Deep Tendon Reflexes: Reflexes are normal and symmetric.  Psychiatric:        Attention and Perception: Attention and perception normal.        Mood and Affect: Mood and affect normal.        Speech: Speech normal.        Behavior: Behavior normal. Behavior is cooperative.        Thought Content: Thought content normal.        Cognition and Memory: Cognition and memory normal.        Judgment: Judgment normal.       Last CBC Lab Results  Component Value Date   WBC 3.1 (L) 03/08/2022   HGB 15.2 03/08/2022   HCT 43.9 03/08/2022   MCV 87 03/08/2022   MCH 30.2 03/08/2022   RDW 12.9 03/08/2022   PLT 313 03/08/2022   Last metabolic panel Lab Results  Component Value Date   GLUCOSE 86 03/08/2022   NA 142 03/08/2022   K 4.4 03/08/2022   CL 104 03/08/2022   CO2 20 03/08/2022   BUN 19 03/08/2022   CREATININE 0.95 03/08/2022   EGFR 93 03/08/2022   CALCIUM 10.4 (H) 03/08/2022   PROT 7.2 03/08/2022   ALBUMIN 4.9 03/08/2022   LABGLOB 2.3 03/08/2022   AGRATIO 2.1 03/08/2022   BILITOT 1.2 03/08/2022   ALKPHOS 76 03/08/2022   AST 25 03/08/2022   ALT 27 03/08/2022   ANIONGAP 6 09/27/2021   Last lipids Lab Results  Component Value Date   CHOL 187 10/05/2021   HDL 53 10/05/2021   LDLCALC  114 (H) 10/05/2021   TRIG 111 10/05/2021   CHOLHDL 3.5 10/05/2021   Last hemoglobin A1c Lab Results  Component Value Date   HGBA1C 4.9 03/19/2019   Last thyroid functions Lab Results  Component Value Date   TSH 1.840 10/05/2021   Last vitamin D Lab Results  Component Value Date   VD25OH 16.5 (L) 10/05/2021       Assessment & Plan:    Routine Health Maintenance and Physical Exam  Immunization History  Administered Date(s) Administered   Moderna Sars-Covid-2 Vaccination 04/04/2019, 05/04/2019   Td 05/29/2015   Tdap 05/29/2015    Health Maintenance  Topic Date Due   Zoster Vaccines- Shingrix (1 of 2) 01/06/2023 (Originally 02/14/2014)   Hepatitis C Screening  10/07/2023 (Originally [birth year]/1982)   Colonoscopy  02/23/2025   DTaP/Tdap/Td (3 - Td or Tdap) 05/28/2025   HIV Screening  Completed   HPV VACCINES  Aged Out   INFLUENZA VACCINE  Discontinued   COVID-19 Vaccine  Discontinued    Discussed health benefits of physical activity, and encouraged him to  engage in regular exercise appropriate for his age and condition.  Problem List Items Addressed This Visit       Cardiovascular and Mediastinum   Essential hypertension   Relevant Medications   amLODipine (NORVASC) 10 MG tablet   atorvastatin (LIPITOR) 10 MG tablet   Other Relevant Orders   CMP14+EGFR   CBC with Differential/Platelet     Digestive   Gastroesophageal reflux disease without esophagitis   Relevant Medications   pantoprazole (PROTONIX) 40 MG tablet   Other Relevant Orders   CBC with Differential/Platelet     Other   Hyperlipidemia   Relevant Medications   amLODipine (NORVASC) 10 MG tablet   atorvastatin (LIPITOR) 10 MG tablet   Other Relevant Orders   Lipid panel   Other Visit Diagnoses     Annual physical exam    -  Primary   Relevant Orders   Hepatitis C antibody   Bayer DCA Hb A1c Waived   CMP14+EGFR   CBC with Differential/Platelet   Lipid panel   Thyroid Panel With TSH   PSA,  total and free   Screening for diabetes mellitus       Relevant Orders   Bayer DCA Hb A1c Waived   Screening for prostate cancer       Relevant Orders   PSA, total and free   Encounter for hepatitis C screening test for low risk patient       Relevant Orders   Hepatitis C antibody   Vitamin D deficiency       Relevant Orders   VITAMIN D 25 Hydroxy (Vit-D Deficiency, Fractures)      Return in about 1 year (around 10/07/2023), or if symptoms worsen or fail to improve, for CPE.     Kari Baars, FNP

## 2022-10-08 LAB — LIPID PANEL
Chol/HDL Ratio: 5.6 ratio — ABNORMAL HIGH (ref 0.0–5.0)
Cholesterol, Total: 240 mg/dL — ABNORMAL HIGH (ref 100–199)
HDL: 43 mg/dL (ref >39)
LDL Chol Calc (NIH): 161 mg/dL — ABNORMAL HIGH (ref 0–99)
Triglycerides: 194 mg/dL — ABNORMAL HIGH (ref 0–149)
VLDL Cholesterol Cal: 36 mg/dL (ref 5–40)

## 2022-10-08 LAB — PSA, TOTAL AND FREE
PSA, Free Pct: 26.7 %
PSA, Free: 0.16 ng/mL
Prostate Specific Ag, Serum: 0.6 ng/mL (ref 0.0–4.0)

## 2022-10-08 LAB — CMP14+EGFR
ALT: 32 IU/L (ref 0–44)
AST: 25 IU/L (ref 0–40)
Albumin: 4.6 g/dL (ref 3.8–4.9)
Alkaline Phosphatase: 75 IU/L (ref 44–121)
BUN/Creatinine Ratio: 17 (ref 9–20)
BUN: 17 mg/dL (ref 6–24)
Bilirubin Total: 1.5 mg/dL — ABNORMAL HIGH (ref 0.0–1.2)
CO2: 24 mmol/L (ref 20–29)
Calcium: 9.6 mg/dL (ref 8.7–10.2)
Chloride: 104 mmol/L (ref 96–106)
Creatinine, Ser: 0.99 mg/dL (ref 0.76–1.27)
Globulin, Total: 2.5 g/dL (ref 1.5–4.5)
Glucose: 86 mg/dL (ref 70–99)
Potassium: 4.7 mmol/L (ref 3.5–5.2)
Sodium: 142 mmol/L (ref 134–144)
Total Protein: 7.1 g/dL (ref 6.0–8.5)
eGFR: 88 mL/min/{1.73_m2} (ref >59)

## 2022-10-08 LAB — CBC WITH DIFFERENTIAL/PLATELET
Basophils Absolute: 0 10*3/uL (ref 0.0–0.2)
Basos: 1 %
EOS (ABSOLUTE): 0.2 10*3/uL (ref 0.0–0.4)
Eos: 4 %
Hematocrit: 44.7 % (ref 37.5–51.0)
Hemoglobin: 15.2 g/dL (ref 13.0–17.7)
Immature Grans (Abs): 0 10*3/uL (ref 0.0–0.1)
Immature Granulocytes: 0 %
Lymphocytes Absolute: 1 10*3/uL (ref 0.7–3.1)
Lymphs: 28 %
MCH: 30.4 pg (ref 26.6–33.0)
MCHC: 34 g/dL (ref 31.5–35.7)
MCV: 89 fL (ref 79–97)
Monocytes Absolute: 0.4 10*3/uL (ref 0.1–0.9)
Monocytes: 13 %
Neutrophils Absolute: 1.9 10*3/uL (ref 1.4–7.0)
Neutrophils: 54 %
Platelets: 294 10*3/uL (ref 150–450)
RBC: 5 x10E6/uL (ref 4.14–5.80)
RDW: 14.2 % (ref 11.6–15.4)
WBC: 3.4 10*3/uL (ref 3.4–10.8)

## 2022-10-08 LAB — THYROID PANEL WITH TSH
Free Thyroxine Index: 1.8 (ref 1.2–4.9)
T3 Uptake Ratio: 28 % (ref 24–39)
T4, Total: 6.3 ug/dL (ref 4.5–12.0)
TSH: 2.33 u[IU]/mL (ref 0.450–4.500)

## 2022-10-08 LAB — HEPATITIS C ANTIBODY: Hep C Virus Ab: NONREACTIVE

## 2022-10-08 LAB — VITAMIN D 25 HYDROXY (VIT D DEFICIENCY, FRACTURES): Vit D, 25-Hydroxy: 20.6 ng/mL — ABNORMAL LOW (ref 30.0–100.0)

## 2023-10-03 ENCOUNTER — Encounter (HOSPITAL_COMMUNITY): Payer: Self-pay

## 2023-10-03 ENCOUNTER — Emergency Department (HOSPITAL_COMMUNITY)
Admission: EM | Admit: 2023-10-03 | Discharge: 2023-10-04 | Disposition: A | Discharge status: Home / Self Care | Attending: Emergency Medicine | Admitting: Emergency Medicine

## 2023-10-03 ENCOUNTER — Other Ambulatory Visit: Payer: Self-pay

## 2023-10-03 DIAGNOSIS — M545 Low back pain, unspecified: Secondary | ICD-10-CM | POA: Diagnosis present

## 2023-10-03 NOTE — ED Triage Notes (Signed)
 Pt c/o left sided flank pain x2 days. Pt with hx of kidney stones. Denies any other symptoms.

## 2023-10-04 ENCOUNTER — Emergency Department (HOSPITAL_COMMUNITY)

## 2023-10-04 LAB — BASIC METABOLIC PANEL WITH GFR
Anion gap: 8 (ref 5–15)
BUN: 19 mg/dL (ref 6–20)
CO2: 25 mmol/L (ref 22–32)
Calcium: 9 mg/dL (ref 8.9–10.3)
Chloride: 107 mmol/L (ref 98–111)
Creatinine, Ser: 0.94 mg/dL (ref 0.61–1.24)
GFR, Estimated: >60 mL/min (ref >60)
Glucose, Bld: 83 mg/dL (ref 70–99)
Potassium: 4.5 mmol/L (ref 3.5–5.1)
Sodium: 140 mmol/L (ref 135–145)

## 2023-10-04 LAB — CBC WITH DIFFERENTIAL/PLATELET
Abs Immature Granulocytes: 0.01 K/uL (ref 0.00–0.07)
Basophils Absolute: 0 K/uL (ref 0.0–0.1)
Basophils Relative: 1 %
Eosinophils Absolute: 0.2 K/uL (ref 0.0–0.5)
Eosinophils Relative: 3 %
HCT: 40.5 % (ref 39.0–52.0)
Hemoglobin: 13.8 g/dL (ref 13.0–17.0)
Immature Granulocytes: 0 %
Lymphocytes Relative: 19 %
Lymphs Abs: 0.9 K/uL (ref 0.7–4.0)
MCH: 31.1 pg (ref 26.0–34.0)
MCHC: 34.1 g/dL (ref 30.0–36.0)
MCV: 91.2 fL (ref 80.0–100.0)
Monocytes Absolute: 0.6 K/uL (ref 0.1–1.0)
Monocytes Relative: 13 %
Neutro Abs: 3.3 K/uL (ref 1.7–7.7)
Neutrophils Relative %: 64 %
Platelets: 263 K/uL (ref 150–400)
RBC: 4.44 MIL/uL (ref 4.22–5.81)
RDW: 13.9 % (ref 11.5–15.5)
WBC: 5.1 K/uL (ref 4.0–10.5)
nRBC: 0 % (ref 0.0–0.2)

## 2023-10-04 LAB — URINALYSIS, W/ REFLEX TO CULTURE (INFECTION SUSPECTED)
Bacteria, UA: NONE SEEN
Bilirubin Urine: NEGATIVE
Glucose, UA: NEGATIVE mg/dL
Hgb urine dipstick: NEGATIVE
Ketones, ur: NEGATIVE mg/dL
Leukocytes,Ua: NEGATIVE
Nitrite: NEGATIVE
Protein, ur: NEGATIVE mg/dL
Specific Gravity, Urine: 1.012 (ref 1.005–1.030)
pH: 7 (ref 5.0–8.0)

## 2023-10-04 MED ORDER — MORPHINE SULFATE (PF) 4 MG/ML IV SOLN
4.0000 mg | Freq: Once | INTRAVENOUS | Status: AC
  Administered 2023-10-04: 4 mg via INTRAVENOUS
  Filled 2023-10-04: qty 1

## 2023-10-04 MED ORDER — METHOCARBAMOL 500 MG PO TABS: 500.0000 mg | ORAL_TABLET | Freq: Two times a day (BID) | ORAL | 0 refills | Status: DC

## 2023-10-04 MED ORDER — NAPROXEN 500 MG PO TABS: 500.0000 mg | ORAL_TABLET | Freq: Two times a day (BID) | ORAL | 0 refills | Status: DC

## 2023-10-04 MED ORDER — KETOROLAC TROMETHAMINE 30 MG/ML IJ SOLN: 30.0000 mg | Freq: Once | INTRAMUSCULAR | Status: DC

## 2023-10-04 MED ORDER — SODIUM CHLORIDE 0.9 % IV BOLUS
1000.0000 mL | Freq: Once | INTRAVENOUS | Status: AC
  Administered 2023-10-04: 1000 mL via INTRAVENOUS

## 2023-10-04 MED ORDER — KETOROLAC TROMETHAMINE 30 MG/ML IJ SOLN
15.0000 mg | Freq: Once | INTRAMUSCULAR | Status: AC
  Administered 2023-10-04: 15 mg via INTRAVENOUS
  Filled 2023-10-04: qty 1

## 2023-10-04 NOTE — ED Notes (Signed)
 Pt given urinal.

## 2023-10-04 NOTE — ED Provider Notes (Signed)
 [place] [facility] AT [facility]  Provider Note  CSN: [id number] Arrival date & time: 10/03/23 2347  History Chief Complaint  Patient presents with   Flank Pain    Matthew Weber is a 58 y.o. male with history of kidney stones reports intermittent L flank/lower back pain for the last 2 days. Some worsening with movement, but also radiating into his LLQ. No dysuria, hematuria, N/V or fever.    Home Medications Prior to Admission medications   Medication Sig Start Date End Date Taking? Authorizing Provider  methocarbamol  (ROBAXIN ) 500 MG tablet Take 1 tablet (500 mg total) by mouth 2 (two) times daily. 10/04/23  Yes Roselyn Carlin NOVAK, MD  naproxen  (NAPROSYN ) 500 MG tablet Take 1 tablet (500 mg total) by mouth 2 (two) times daily. 10/04/23  Yes Roselyn Carlin NOVAK, MD  amLODipine  (NORVASC ) 10 MG tablet Take 1 tablet (10 mg total) by mouth daily. 10/07/22   Severa Rock HERO, FNP  atorvastatin  (LIPITOR) 10 MG tablet Take 1 tablet (10 mg total) by mouth daily. 10/07/22   Severa Rock HERO, FNP  Cholecalciferol (VITAMIN D3) 50 MCG (2000 UT) capsule Take 1 capsule (2,000 Units total) by mouth daily. 10/07/22   Severa Rock HERO, FNP  fluticasone  (FLONASE ) 50 MCG/ACT nasal spray Place 2 sprays into both nostrils daily. 04/07/22   Severa Rock HERO, FNP  pantoprazole  (PROTONIX ) 40 MG tablet Take 1 tablet (40 mg total) by mouth daily. For stomach 10/07/22   Severa Rock HERO, FNP     Allergies    Ace inhibitors   Review of Systems   Review of Systems Please see HPI for pertinent positives and negatives  Physical Exam BP (!) 156/91   Pulse 60   Temp 98 F (36.7 C) (Oral)   Resp 17   SpO2 97%   Physical Exam Vitals and nursing note reviewed.  Constitutional:      Appearance: Normal appearance.  HENT:     Head: Normocephalic and atraumatic.     Nose: Nose normal.     Mouth/Throat:     Mouth: Mucous membranes are moist.  Eyes:     Extraocular Movements: Extraocular movements  intact.     Conjunctiva/sclera: Conjunctivae normal.  Cardiovascular:     Rate and Rhythm: Normal rate.  Pulmonary:     Effort: Pulmonary effort is normal.     Breath sounds: Normal breath sounds.  Abdominal:     General: Abdomen is flat.     Palpations: Abdomen is soft.     Tenderness: There is no abdominal tenderness.  Musculoskeletal:        General: Tenderness (mild tenderness L lumbar paraspinal muscles) present. No swelling. Normal range of motion.     Cervical back: Neck supple.  Skin:    General: Skin is warm and dry.  Neurological:     General: No focal deficit present.     Mental Status: He is alert.  Psychiatric:        Mood and Affect: Mood normal.     ED Results / Procedures / Treatments   EKG None  Procedures Procedures  Medications Ordered in the ED Medications  morphine  (PF) 4 MG/ML injection 4 mg (4 mg Intravenous Given 10/04/23 0030)  sodium chloride  0.9 % bolus 1,000 mL (1,000 mLs Intravenous New Bag/Given 10/04/23 0029)  ketorolac  (TORADOL ) 30 MG/ML injection 15 mg (15 mg Intravenous Given 10/04/23 0132)    Initial Impression and Plan  Patient here with L flank pain, some  features of renal colic vs MSK pain. Will check labs, send for CT and give pain medication for comfort.   ED Course   Clinical Course as of 10/04/23 0206  Wed Oct 04, 2023  0049 I personally viewed the images from radiology studies and agree with radiologist interpretation: CT neg for ureteral stone. Possible mild colitis not consistent with history.  [CS]  0104 CBC is normal.  [CS]  0122 BMP is normal.  [CS]  0205 UA is clear. Patient reports some improvement in pain, still hurts with movement making MSK more likely. Will d/c with Rx for Naprosyn , Robaxin . Recommend rest, heat and PCP follow up, RTED for any other concerns.   [CS]    Clinical Course User Index [CS] Roselyn Carlin NOVAK, MD     MDM Rules/Calculators/A&P Medical Decision Making Problems Addressed: Acute  left-sided low back pain without sciatica: acute illness or injury  Amount and/or Complexity of Data Reviewed Labs: ordered. Decision-making details documented in ED Course. Radiology: ordered and independent interpretation performed. Decision-making details documented in ED Course.  Risk Prescription drug management. Parenteral controlled substances.     Final Clinical Impression(s) / ED Diagnoses Final diagnoses:  Acute left-sided low back pain without sciatica    Rx / DC Orders ED Discharge Orders          Ordered    naproxen  (NAPROSYN ) 500 MG tablet  2 times daily        10/04/23 0206    methocarbamol  (ROBAXIN ) 500 MG tablet  2 times daily        10/04/23 0206             Roselyn Carlin NOVAK, MD 10/04/23 [phone]

## 2023-10-04 NOTE — ED Notes (Signed)
 Patient transported to CT

## 2023-10-04 NOTE — ED Notes (Signed)
 ED Provider at bedside.

## 2023-10-10 ENCOUNTER — Ambulatory Visit (INDEPENDENT_AMBULATORY_CARE_PROVIDER_SITE_OTHER): Payer: 59 | Admitting: Family Medicine

## 2023-10-10 ENCOUNTER — Encounter: Payer: Self-pay | Admitting: Family Medicine

## 2023-10-10 VITALS — BP 149/89 | HR 58 | Temp 97.6°F | Ht 69.0 in | Wt 217.0 lb

## 2023-10-10 DIAGNOSIS — K219 Gastro-esophageal reflux disease without esophagitis: Secondary | ICD-10-CM

## 2023-10-10 DIAGNOSIS — Z0000 Encounter for general adult medical examination without abnormal findings: Secondary | ICD-10-CM

## 2023-10-10 DIAGNOSIS — E559 Vitamin D deficiency, unspecified: Secondary | ICD-10-CM | POA: Diagnosis not present

## 2023-10-10 DIAGNOSIS — M545 Low back pain, unspecified: Secondary | ICD-10-CM

## 2023-10-10 DIAGNOSIS — I10 Essential (primary) hypertension: Secondary | ICD-10-CM | POA: Diagnosis not present

## 2023-10-10 DIAGNOSIS — K429 Umbilical hernia without obstruction or gangrene: Secondary | ICD-10-CM | POA: Diagnosis not present

## 2023-10-10 DIAGNOSIS — E782 Mixed hyperlipidemia: Secondary | ICD-10-CM

## 2023-10-10 DIAGNOSIS — Z0184 Encounter for antibody response examination: Secondary | ICD-10-CM

## 2023-10-10 DIAGNOSIS — R351 Nocturia: Secondary | ICD-10-CM

## 2023-10-10 DIAGNOSIS — J301 Allergic rhinitis due to pollen: Secondary | ICD-10-CM

## 2023-10-10 MED ORDER — AMLODIPINE BESYLATE 10 MG PO TABS: 10.0000 mg | ORAL_TABLET | Freq: Every day | ORAL | 1 refills | Status: AC

## 2023-10-10 MED ORDER — ATORVASTATIN CALCIUM 10 MG PO TABS: 10.0000 mg | ORAL_TABLET | Freq: Every day | ORAL | 1 refills | Status: AC

## 2023-10-10 NOTE — Progress Notes (Signed)
 Complete physical exam  Patient: Matthew Weber   DOB: Dec 28, 1965   58 y.o. Male  MRN: [id number]  Subjective:    Chief Complaint  Patient presents with   Annual Exam    Matthew Weber is a 58 y.o. male who presents today for a complete physical exam. He reports consuming a general diet. The patient does not participate in regular exercise at present. He generally feels well. He reports sleeping well. He does have additional problems to discuss today.   He has been experiencing significant back pain for the past two to three weeks, which began after pushing a lawn mower onto a trailer and worsened after unloading it. The pain is severe, preventing him from laying down, sitting up, or picking his legs up, and he finds relief by stretching out on his hands and knees. Initially suspecting a kidney stone due to his history, a CT scan showed only small stones with no significant obstruction. The pain is localized to the left side and occasionally spreads to the right, without radiating down his legs.  He is currently taking naproxen  once a day and methocarbamol  as needed for the back pain. He is also managing his blood pressure and sinus medications, and is taking naproxen  once a day.  He has a history of a hernia that becomes hard when standing for long periods, resolving upon sitting.  In terms of family history, his wife has been experiencing chest tightness and pressure, and he is undergoing further testing. His mother recently passed away after a period of declining health, including fainting and being wheelchair-bound for three years.  No bowel movement issues, hearing or vision problems, dental issues, recent illnesses, or other joint pain. He gets up at night to urinate when drinking a lot. No chest pain, headaches, or leg swelling. Occasionally uses allergy medication as needed.       Most recent fall risk assessment:    10/10/2023    8:40 AM  Fall Risk   Falls in the past year? 0  Risk  for fall due to : No Fall Risks  Follow up Falls evaluation completed     Most recent depression screenings:    10/10/2023    8:41 AM 10/07/2022    8:30 AM  PHQ 2/9 Scores  PHQ - 2 Score 1 0  PHQ- 9 Score 2 1    Vision:Within last year and Dental: No current dental problems  Patient Active Problem List   Diagnosis Date Noted   Vitamin D  deficiency 10/10/2023   Gastroesophageal reflux disease without esophagitis 03/23/2020   Hyperlipidemia 03/08/2018   Essential hypertension 08/17/2016   Allergic rhinitis due to pollen 08/17/2016   Lipoma of neck 08/09/2016   Past Medical History:  Diagnosis Date   Hyperlipidemia    Hypertension    Kidney stones    History reviewed. No pertinent surgical history. Social History   Tobacco Use   Smoking status: Never   Smokeless tobacco: Never  Vaping Use   Vaping status: Never Used  Substance Use Topics   Alcohol use: No   Drug use: No   Social History   Socioeconomic History   Marital status: Married    Spouse name: Not on file   Number of children: 2   Years of education: Not on file   Highest education level: Not on file  Occupational History   Not on file  Tobacco Use   Smoking status: Never   Smokeless tobacco: Never  Vaping Use  Vaping status: Never Used  Substance and Sexual Activity   Alcohol use: No   Drug use: No   Sexual activity: Not on file  Other Topics Concern   Not on file  Social History Narrative   Not on file   Social Drivers of Health   Financial Resource Strain: Not on file  Food Insecurity: Not on file  Transportation Needs: Not on file  Physical Activity: Not on file  Stress: Not on file  Social Connections: Not on file  Intimate Partner Violence: Not on file   Family Status  Relation Name Status   Mother  Alive   Father  Deceased   Sister  Alive   Brother  Alive   Daughter  Alive   Son  Alive   Sister  Alive   Sister  Alive   Sister Half sisiter Alive   Brother  Alive  No  partnership data on file   Family History  Problem Relation Age of Onset   Diabetes Mother    Hypertension Mother    Heart attack Father    Allergies  Allergen Reactions   Ace Inhibitors Swelling      Patient Care Team: Severa Rock HERO, FNP as PCP - General (Family Medicine)   Outpatient Medications Prior to Visit  Medication Sig   fluticasone  (FLONASE ) 50 MCG/ACT nasal spray Place 2 sprays into both nostrils daily.   methocarbamol  (ROBAXIN ) 500 MG tablet Take 1 tablet (500 mg total) by mouth 2 (two) times daily.   naproxen  (NAPROSYN ) 500 MG tablet Take 1 tablet (500 mg total) by mouth 2 (two) times daily.   [DISCONTINUED] amLODipine  (NORVASC ) 10 MG tablet Take 1 tablet (10 mg total) by mouth daily.   [DISCONTINUED] atorvastatin  (LIPITOR) 10 MG tablet Take 1 tablet (10 mg total) by mouth daily.   [DISCONTINUED] Cholecalciferol (VITAMIN D3) 50 MCG (2000 UT) capsule Take 1 capsule (2,000 Units total) by mouth daily.   [DISCONTINUED] pantoprazole  (PROTONIX ) 40 MG tablet Take 1 tablet (40 mg total) by mouth daily. For stomach   No facility-administered medications prior to visit.    ROS per HPI      Objective:     BP (!) 149/89   Pulse (!) 58   Temp 97.6 F (36.4 C)   Ht 5' 9 (1.753 m)   Wt 217 lb (98.4 kg)   SpO2 99%   BMI 32.05 kg/m  BP Readings from Last 3 Encounters:  10/10/23 (!) 149/89  10/04/23 (!) 156/91  10/07/22 (!) 143/88   Wt Readings from Last 3 Encounters:  10/10/23 217 lb (98.4 kg)  10/07/22 215 lb 12.8 oz (97.9 kg)  06/15/22 205 lb 3.2 oz (93.1 kg)   SpO2 Readings from Last 3 Encounters:  10/10/23 99%  10/04/23 97%  10/07/22 96%      Physical Exam Vitals and nursing note reviewed.  Constitutional:      General: He is not in acute distress.    Appearance: Normal appearance. He is well-developed and well-groomed. He is obese. He is not ill-appearing, toxic-appearing or diaphoretic.  HENT:     Head: Normocephalic and atraumatic.     Jaw:  There is normal jaw occlusion.     Right Ear: Hearing, tympanic membrane, ear canal and external ear normal.     Left Ear: Hearing, tympanic membrane, ear canal and external ear normal.     Nose: Nose normal.     Mouth/Throat:     Lips: Pink.     Mouth:  Mucous membranes are moist.     Pharynx: Oropharynx is clear. Uvula midline.  Eyes:     General: Lids are normal.     Extraocular Movements: Extraocular movements intact.     Conjunctiva/sclera: Conjunctivae normal.     Pupils: Pupils are equal, round, and reactive to light.  Neck:     Thyroid : No thyroid  mass, thyromegaly or thyroid  tenderness.     Vascular: No carotid bruit or JVD.     Trachea: Trachea and phonation normal.  Cardiovascular:     Rate and Rhythm: Normal rate and regular rhythm.     Chest Wall: PMI is not displaced.     Pulses:          Dorsalis pedis pulses are 2+ on the right side and 2+ on the left side.       Posterior tibial pulses are 2+ on the right side and 2+ on the left side.     Heart sounds: Normal heart sounds. No murmur heard.    No friction rub. No gallop.  Pulmonary:     Effort: Pulmonary effort is normal. No respiratory distress.     Breath sounds: Normal breath sounds. No wheezing.  Chest:     Chest wall: No mass.  Breasts:    Breasts are symmetrical.  Abdominal:     General: Abdomen is flat. Bowel sounds are normal. There is no distension or abdominal bruit.     Palpations: Abdomen is soft. There is no hepatomegaly or splenomegaly.     Tenderness: There is no abdominal tenderness. There is no right CVA tenderness or left CVA tenderness.     Hernia: A hernia is present. Hernia is present in the umbilical area (soft and reducible).  Musculoskeletal:        General: Normal range of motion.     Cervical back: Full passive range of motion without pain, normal range of motion and neck supple.     Right lower leg: No edema.     Left lower leg: No edema.  Feet:     Right foot:     Skin integrity:  Skin integrity normal.     Toenail Condition: Right toenails are normal.     Left foot:     Skin integrity: Skin integrity normal.     Toenail Condition: Left toenails are normal.  Lymphadenopathy:     Cervical: No cervical adenopathy.     Upper Body:     Right upper body: No supraclavicular, axillary or pectoral adenopathy.     Left upper body: No supraclavicular, axillary or pectoral adenopathy.  Skin:    General: Skin is warm and dry.     Capillary Refill: Capillary refill takes less than 2 seconds.     Coloration: Skin is not cyanotic, jaundiced or pale.     Findings: No rash.  Neurological:     General: No focal deficit present.     Mental Status: He is alert and oriented to person, place, and time.     Sensory: Sensation is intact.     Motor: Motor function is intact.     Coordination: Coordination is intact.     Gait: Gait is intact.     Deep Tendon Reflexes: Reflexes are normal and symmetric.  Psychiatric:        Attention and Perception: Attention and perception normal.        Mood and Affect: Mood and affect normal.        Speech: Speech normal.  Behavior: Behavior normal. Behavior is cooperative.        Thought Content: Thought content normal.        Cognition and Memory: Cognition and memory normal.        Judgment: Judgment normal.       Last CBC Lab Results  Component Value Date   WBC 5.1 10/04/2023   HGB 13.8 10/04/2023   HCT 40.5 10/04/2023   MCV 91.2 10/04/2023   MCH 31.1 10/04/2023   RDW 13.9 10/04/2023   PLT 263 10/04/2023   Last metabolic panel Lab Results  Component Value Date   GLUCOSE 83 10/04/2023   NA 140 10/04/2023   K 4.5 10/04/2023   CL 107 10/04/2023   CO2 25 10/04/2023   BUN 19 10/04/2023   CREATININE 0.94 10/04/2023   GFRNONAA >60 10/04/2023   CALCIUM  9.0 10/04/2023   PROT 7.1 10/07/2022   ALBUMIN 4.6 10/07/2022   LABGLOB 2.5 10/07/2022   AGRATIO 2.1 03/08/2022   BILITOT 1.5 (H) 10/07/2022   ALKPHOS 75 10/07/2022    AST 25 10/07/2022   ALT 32 10/07/2022   ANIONGAP 8 10/04/2023   Last lipids Lab Results  Component Value Date   CHOL 240 (H) 10/07/2022   HDL 43 10/07/2022   LDLCALC 161 (H) 10/07/2022   TRIG 194 (H) 10/07/2022   CHOLHDL 5.6 (H) 10/07/2022   Last hemoglobin A1c Lab Results  Component Value Date   HGBA1C 5.3 10/07/2022   Last thyroid  functions Lab Results  Component Value Date   TSH 2.330 10/07/2022   T4TOTAL 6.3 10/07/2022   Last vitamin D  Lab Results  Component Value Date   VD25OH 20.6 (L) 10/07/2022       Assessment & Plan:    Routine Health Maintenance and Physical Exam  Immunization History  Administered Date(s) Administered   Moderna Sars-Covid-2 Vaccination 04/04/2019, 05/04/2019   Td 05/29/2015   Tdap 05/29/2015    Health Maintenance  Topic Date Due   Hepatitis B Vaccines 19-59 Average Risk (1 of 3 - 19+ 3-dose series) Never done   Zoster Vaccines- Shingrix (1 of 2) Never done   Pneumococcal Vaccine: 50+ Years (1 of 1 - PCV) 10/09/2024 (Originally 02/14/2014)   Colonoscopy  02/23/2025   DTaP/Tdap/Td (3 - Td or Tdap) 05/28/2025   Hepatitis C Screening  Completed   HIV Screening  Completed   HPV VACCINES  Aged Out   Meningococcal B Vaccine  Aged Out   Influenza Vaccine  Discontinued   COVID-19 Vaccine  Discontinued    Discussed health benefits of physical activity, and encouraged him to engage in regular exercise appropriate for his age and condition.  Problem List Items Addressed This Visit       Cardiovascular and Mediastinum   Essential hypertension   Relevant Medications   atorvastatin  (LIPITOR) 10 MG tablet   amLODipine  (NORVASC ) 10 MG tablet   Other Relevant Orders   CBC with Differential/Platelet   CMP14+EGFR   Lipid panel   Thyroid  Panel With TSH     Respiratory   Allergic rhinitis due to pollen     Digestive   Gastroesophageal reflux disease without esophagitis   Relevant Orders   CBC with Differential/Platelet     Other    Hyperlipidemia   Relevant Medications   atorvastatin  (LIPITOR) 10 MG tablet   amLODipine  (NORVASC ) 10 MG tablet   Other Relevant Orders   CMP14+EGFR   Lipid panel   Vitamin D  deficiency   Relevant Orders   CMP14+EGFR  Vitamin D , 25-hydroxy   Other Visit Diagnoses       Annual physical exam    -  Primary   Relevant Orders   CBC with Differential/Platelet   CMP14+EGFR   Lipid panel     Immunity status testing       Relevant Orders   Hepatitis B surface antibody,qualitative     Nocturia       Relevant Orders   PSA, total and free     Acute left-sided low back pain without sciatica         Umbilical hernia without obstruction and without gangrene       Relevant Orders   Ambulatory referral to General Surgery         Inguinal hernia Chronic inguinal hernia with recent episodes of hardening when standing for long periods. Risk of incarceration or strangulation if not addressed. - Refer to general surgeon for evaluation and potential repair to prevent complications such as bowel obstruction and tissue necrosis.  Low back pain with sciatica Recent episode of low back pain with sciatica, primarily on the left side, with some radiation to the right. No leg or hip involvement. Managed with naproxen  and methocarbamol , which have been effective. - Continue naproxen  twice daily with food for anti-inflammatory effects. - Use methocarbamol  as needed for muscle relaxation.  Essential hypertension -Well controlled. Continue current regimen  Gastroesophageal reflux disease GERD, currently not requiring regular medication. Symptoms may have been exacerbated by stress.  Allergic rhinitis Seasonal allergic rhinitis, managed with medication as needed. - Use allergy medication as needed, especially during fall season.  Adult Wellness Visit Routine adult wellness visit. Colonoscopy is up to date and tetanus vaccination is current. Discussed the importance of the shingles vaccine,  especially given his age and potential history of chickenpox. - Update labs. - Check PSA. - Discuss shingles vaccine with pharmacy for pricing and obtain vaccine at pharmacy if feasible.      Return in about 1 year (around 10/09/2024) for Annual Physical.     Rosaline Bruns, FNP-C [facility] [address] [place], [place] [address] [phone]

## 2023-10-11 LAB — LIPID PANEL
Chol/HDL Ratio: 3.8 ratio (ref 0.0–5.0)
Cholesterol, Total: 163 mg/dL (ref 100–199)
HDL: 43 mg/dL (ref >39)
LDL Chol Calc (NIH): 104 mg/dL — ABNORMAL HIGH (ref 0–99)
Triglycerides: 85 mg/dL (ref 0–149)
VLDL Cholesterol Cal: 16 mg/dL (ref 5–40)

## 2023-10-11 LAB — CBC WITH DIFFERENTIAL/PLATELET
Basophils Absolute: 0 x10E3/uL (ref 0.0–0.2)
Basos: 1 %
EOS (ABSOLUTE): 0.2 x10E3/uL (ref 0.0–0.4)
Eos: 6 %
Hematocrit: 43.3 % (ref 37.5–51.0)
Hemoglobin: 14.5 g/dL (ref 13.0–17.7)
Immature Grans (Abs): 0 x10E3/uL (ref 0.0–0.1)
Immature Granulocytes: 0 %
Lymphocytes Absolute: 0.9 x10E3/uL (ref 0.7–3.1)
Lymphs: 23 %
MCH: 30.4 pg (ref 26.6–33.0)
MCHC: 33.5 g/dL (ref 31.5–35.7)
MCV: 91 fL (ref 79–97)
Monocytes Absolute: 0.5 x10E3/uL (ref 0.1–0.9)
Monocytes: 13 %
Neutrophils Absolute: 2.1 x10E3/uL (ref 1.4–7.0)
Neutrophils: 57 %
Platelets: 274 x10E3/uL (ref 150–450)
RBC: 4.77 x10E6/uL (ref 4.14–5.80)
RDW: 13.6 % (ref 11.6–15.4)
WBC: 3.6 x10E3/uL (ref 3.4–10.8)

## 2023-10-11 LAB — CMP14+EGFR
ALT: 66 IU/L — ABNORMAL HIGH (ref 0–44)
AST: 36 IU/L (ref 0–40)
Albumin: 4.4 g/dL (ref 3.8–4.9)
Alkaline Phosphatase: 84 IU/L (ref 47–123)
BUN/Creatinine Ratio: 20 (ref 9–20)
BUN: 18 mg/dL (ref 6–24)
Bilirubin Total: 1.2 mg/dL (ref 0.0–1.2)
CO2: 20 mmol/L (ref 20–29)
Calcium: 9.5 mg/dL (ref 8.7–10.2)
Chloride: 105 mmol/L (ref 96–106)
Creatinine, Ser: 0.88 mg/dL (ref 0.76–1.27)
Globulin, Total: 2.2 g/dL (ref 1.5–4.5)
Glucose: 82 mg/dL (ref 70–99)
Potassium: 3.9 mmol/L (ref 3.5–5.2)
Sodium: 140 mmol/L (ref 134–144)
Total Protein: 6.6 g/dL (ref 6.0–8.5)
eGFR: 99 mL/min/1.73 (ref >59)

## 2023-10-11 LAB — THYROID PANEL WITH TSH
Free Thyroxine Index: 1.6 (ref 1.2–4.9)
T3 Uptake Ratio: 26 % (ref 24–39)
T4, Total: 6.3 ug/dL (ref 4.5–12.0)
TSH: 1.85 u[IU]/mL (ref 0.450–4.500)

## 2023-10-11 LAB — PSA, TOTAL AND FREE
PSA, Free Pct: 25.7 %
PSA, Free: 0.18 ng/mL
Prostate Specific Ag, Serum: 0.7 ng/mL (ref 0.0–4.0)

## 2023-10-11 LAB — VITAMIN D 25 HYDROXY (VIT D DEFICIENCY, FRACTURES): Vit D, 25-Hydroxy: 19.7 ng/mL — AB (ref 30.0–100.0)

## 2023-10-11 LAB — HEPATITIS B SURFACE ANTIBODY,QUALITATIVE: Hep B Surface Ab, Qual: NONREACTIVE

## 2023-10-12 ENCOUNTER — Ambulatory Visit: Payer: Self-pay | Admitting: Family Medicine

## 2023-11-14 ENCOUNTER — Other Ambulatory Visit: Payer: Self-pay | Admitting: Family Medicine

## 2023-11-14 DIAGNOSIS — R0981 Nasal congestion: Secondary | ICD-10-CM

## 2024-01-09 ENCOUNTER — Encounter: Payer: Self-pay | Admitting: Surgery

## 2024-01-09 ENCOUNTER — Ambulatory Visit: Admitting: Surgery

## 2024-01-09 VITALS — BP 147/88 | HR 59 | Temp 97.6°F | Resp 16 | Ht 69.0 in | Wt 226.0 lb

## 2024-01-09 DIAGNOSIS — K429 Umbilical hernia without obstruction or gangrene: Secondary | ICD-10-CM | POA: Diagnosis not present

## 2024-01-10 NOTE — Progress Notes (Unsigned)
 [facility] History and Physical  Reason for Referral: Umbilical hernia Referring Physician: Rock Bruns, NP  Chief Complaint   New Patient (Initial Visit)     Oluwatobi Visser is a 58 y.o. male.  HPI: Patient presents for evaluation of an umbilical hernia.  He believes that it has been present for years but he first noticed it 1 to 2 weeks after Halloween.  The area will flare every once in a while where he feels hardening around the hernia when he is standing for long periods of time.  He also notes more flares when he is bending.  The area will get better with relaxation.  He is tolerating a diet without nausea and vomiting and is having regular bowel movements.  His past medical history is significant for hypertension and hyperlipidemia.  He denies use of blood thinning medications.  He denies history of abdominal surgeries.  He rarely will consume alcohol.  He denies use of tobacco products, alcohol, and illicit drugs.  Past Medical History:  Diagnosis Date   Hyperlipidemia    Hypertension    Kidney stones     No past surgical history on file.  Family History  Problem Relation Age of Onset   Diabetes Mother    Hypertension Mother    Heart attack Father     Social History[1]  Medications: I have reviewed the patient's current medications. Allergies as of 01/09/2024       Reactions   Ace Inhibitors Swelling        Medication List        Accurate as of January 09, 2024 11:59 PM. If you have any questions, ask your nurse or doctor.          STOP taking these medications    methocarbamol  500 MG tablet Commonly known as: ROBAXIN  Stopped by: Dorothyann Taren Dymek, DO   naproxen  500 MG tablet Commonly known as: NAPROSYN  Stopped by: Dorothyann Brittle, DO       TAKE these medications    amLODipine  10 MG tablet Commonly known as: NORVASC  Take 1 tablet (10 mg total) by mouth daily.   atorvastatin  10 MG tablet Commonly known as:  Lipitor Take 1 tablet (10 mg total) by mouth daily.   fluticasone  50 MCG/ACT nasal spray Commonly known as: FLONASE  Use 2 spray(s) in each nostril once daily   loratadine  10 MG dissolvable tablet Commonly known as: CLARITIN  REDITABS Take 10 mg by mouth daily as needed for allergies.         ROS:  Constitutional: negative for chills, fatigue, and fevers Eyes: negative for visual disturbance and pain Ears, nose, mouth, throat, and face: positive for sinus problems, negative for ear drainage and sore throat Respiratory: negative for cough, wheezing, and shortness of breath Cardiovascular: negative for chest pain and palpitations Gastrointestinal: negative for abdominal pain, nausea, reflux symptoms, and vomiting Genitourinary:negative for dysuria and frequency Integument/breast: negative for dryness and rash Hematologic/lymphatic: negative for bleeding and lymphadenopathy Musculoskeletal:negative for back pain and neck pain Neurological: negative for dizziness and tremors Endocrine: negative for temperature intolerance  Blood pressure (!) 147/88, pulse (!) 59, temperature 97.6 F (36.4 C), temperature source Oral, resp. rate 16, height 5' 9 (1.753 m), weight 226 lb (102.5 kg), SpO2 97%. Physical Exam Vitals reviewed.  Constitutional:      Appearance: Normal appearance.  HENT:     Head: Normocephalic and atraumatic.  Eyes:     Extraocular Movements: Extraocular movements intact.     Pupils: Pupils are equal, round,  and reactive to light.  Cardiovascular:     Rate and Rhythm: Normal rate and regular rhythm.  Pulmonary:     Effort: Pulmonary effort is normal.     Breath sounds: Normal breath sounds.  Abdominal:     Comments: Distended, no percussion tenderness, nontender to palpation; no rigidity, guarding, rebound tenderness; soft umbilical hernia, unable to reduce, mild tenderness to palpation  Musculoskeletal:        General: Normal range of motion.     Cervical back:  Normal range of motion.  Skin:    General: Skin is warm and dry.  Neurological:     General: No focal deficit present.     Mental Status: He is alert and oriented to person, place, and time.  Psychiatric:        Mood and Affect: Mood normal.        Behavior: Behavior normal.     Results: CT abdomen and pelvis (10/04/2023): IMPRESSION: 1. Possible mild descending colitis. 2. Punctate nonobstructing nephrolithiasis bilaterally. No hydronephrosis.  **Fat-containing umbilical hernia  Assessment & Plan:  Javoris Star is a 58 y.o. male who presents for evaluation of an umbilical hernia.  -I discussed the pathophysiology of umbilical hernias and we discussed the recommendations for surgical repair -The risk and benefits of robotic assisted laparoscopic umbilical hernia repair with mesh were discussed including but not limited to bleeding, infection, injury to surrounding structures, need for additional procedures, and hernia recurrence.  After careful consideration, Dhruv Christina has decided to proceed with surgery.  -Patient tentatively scheduled for surgery on 1/8 -Information provided to the patient regarding umbilical hernias -Advised that the patient should present to the ED if they begin to have worsening pain that umbilical bulge, nonreducible, skin changes overlying umbilicus, nausea, vomiting, and obstipation  All questions were answered to the satisfaction of the patient.  Note: Portions of this report may have been transcribed using voice recognition software. Every effort has been made to ensure accuracy; however, inadvertent computerized transcription errors may still be present.   Dorothyann Brittle, DO [facility] [address] [place], [place] [address] [phone] (office)         [1]  Social History Tobacco Use   Smoking status: Never   Smokeless tobacco: Never  Vaping Use   Vaping status: Never Used  Substance Use Topics    Alcohol use: No   Drug use: No

## 2024-01-11 NOTE — H&P (Signed)
 [facility] History and Physical  Reason for Referral: Umbilical hernia Referring Physician: Rock Bruns, NP  Chief Complaint   New Patient (Initial Visit)     Matthew Weber is a 58 y.o. male.  HPI: Patient presents for evaluation of an umbilical hernia.  He believes that it has been present for years but he first noticed it 1 to 2 weeks after Halloween.  The area will flare every once in a while where he feels hardening around the hernia when he is standing for long periods of time.  He also notes more flares when he is bending.  The area will get better with relaxation.  He is tolerating a diet without nausea and vomiting and is having regular bowel movements.  His past medical history is significant for hypertension and hyperlipidemia.  He denies use of blood thinning medications.  He denies history of abdominal surgeries.  He rarely will consume alcohol.  He denies use of tobacco products, alcohol, and illicit drugs.  Past Medical History:  Diagnosis Date   Hyperlipidemia    Hypertension    Kidney stones     No past surgical history on file.  Family History  Problem Relation Age of Onset   Diabetes Mother    Hypertension Mother    Heart attack Father     [Social History]  [Social History] Tobacco Use   Smoking status: Never   Smokeless tobacco: Never  Vaping Use   Vaping status: Never Used  Substance Use Topics   Alcohol use: No   Drug use: No    Medications: I have reviewed the patient's current medications. Allergies as of 01/09/2024       Reactions   Ace Inhibitors Swelling        Medication List        Accurate as of January 09, 2024 11:59 PM. If you have any questions, ask your nurse or doctor.          STOP taking these medications    methocarbamol  500 MG tablet Commonly known as: ROBAXIN  Stopped by: Dorothyann Jawara Latorre, DO   naproxen  500 MG tablet Commonly known as: NAPROSYN  Stopped by: Dorothyann Brittle, DO        TAKE these medications    amLODipine  10 MG tablet Commonly known as: NORVASC  Take 1 tablet (10 mg total) by mouth daily.   atorvastatin  10 MG tablet Commonly known as: Lipitor Take 1 tablet (10 mg total) by mouth daily.   fluticasone  50 MCG/ACT nasal spray Commonly known as: FLONASE  Use 2 spray(s) in each nostril once daily   loratadine  10 MG dissolvable tablet Commonly known as: CLARITIN  REDITABS Take 10 mg by mouth daily as needed for allergies.         ROS:  Constitutional: negative for chills, fatigue, and fevers Eyes: negative for visual disturbance and pain Ears, nose, mouth, throat, and face: positive for sinus problems, negative for ear drainage and sore throat Respiratory: negative for cough, wheezing, and shortness of breath Cardiovascular: negative for chest pain and palpitations Gastrointestinal: negative for abdominal pain, nausea, reflux symptoms, and vomiting Genitourinary:negative for dysuria and frequency Integument/breast: negative for dryness and rash Hematologic/lymphatic: negative for bleeding and lymphadenopathy Musculoskeletal:negative for back pain and neck pain Neurological: negative for dizziness and tremors Endocrine: negative for temperature intolerance  Blood pressure (!) 147/88, pulse (!) 59, temperature 97.6 F (36.4 C), temperature source Oral, resp. rate 16, height 5' 9 (1.753 m), weight 226 lb (102.5 kg), SpO2 97%. Physical Exam Vitals reviewed.  Constitutional:      Appearance: Normal appearance.  HENT:     Head: Normocephalic and atraumatic.  Eyes:     Extraocular Movements: Extraocular movements intact.     Pupils: Pupils are equal, round, and reactive to light.  Cardiovascular:     Rate and Rhythm: Normal rate and regular rhythm.  Pulmonary:     Effort: Pulmonary effort is normal.     Breath sounds: Normal breath sounds.  Abdominal:     Comments: Distended, no percussion tenderness, nontender to palpation; no rigidity,  guarding, rebound tenderness; soft umbilical hernia, unable to reduce, mild tenderness to palpation  Musculoskeletal:        General: Normal range of motion.     Cervical back: Normal range of motion.  Skin:    General: Skin is warm and dry.  Neurological:     General: No focal deficit present.     Mental Status: He is alert and oriented to person, place, and time.  Psychiatric:        Mood and Affect: Mood normal.        Behavior: Behavior normal.     Results: CT abdomen and pelvis (10/04/2023): IMPRESSION: 1. Possible mild descending colitis. 2. Punctate nonobstructing nephrolithiasis bilaterally. No hydronephrosis.  **Fat-containing umbilical hernia  Assessment & Plan:  Matthew Weber is a 58 y.o. male who presents for evaluation of an umbilical hernia.  -I discussed the pathophysiology of umbilical hernias and we discussed the recommendations for surgical repair -The risk and benefits of robotic assisted laparoscopic umbilical hernia repair with mesh were discussed including but not limited to bleeding, infection, injury to surrounding structures, need for additional procedures, and hernia recurrence.  After careful consideration, Matthew Weber has decided to proceed with surgery.  -Patient tentatively scheduled for surgery on 1/8 -Information provided to the patient regarding umbilical hernias -Advised that the patient should present to the ED if they begin to have worsening pain that umbilical bulge, nonreducible, skin changes overlying umbilicus, nausea, vomiting, and obstipation  All questions were answered to the satisfaction of the patient.  Note: Portions of this report may have been transcribed using voice recognition software. Every effort has been made to ensure accuracy; however, inadvertent computerized transcription errors may still be present.   Dorothyann Brittle, DO [facility] [address] [place], [place]  [address] [phone] (office)

## 2024-01-29 NOTE — Patient Instructions (Signed)
 "        Matthew Weber  01/29/2024     @PREFPERIOPPHARMACY @   Your procedure is scheduled on  02/01/2024.   Report to [facility] at  0600 A.M.   Call this number if you have problems the morning of surgery:  [phone]  If you experience any cold or flu symptoms such as cough, fever, chills, shortness of breath, etc. between now and your scheduled surgery, please notify us  at the above number.   Remember:  Do not eat or drink after midnight.      Take these medicines the morning of surgery with A SIP OF WATER                                                   Amlodipine .    Do not wear jewelry, make-up or nail polish, including gel polish,  artificial nails, or any other type of covering on natural nails (fingers and  toes).  Do not wear lotions, powders, or perfumes, or deodorant.  Do not shave 48 hours prior to surgery.  Men may shave face and neck.  Do not bring valuables to the hospital.  [facility] is not responsible for any belongings or valuables.  Contacts, dentures or bridgework may not be worn into surgery.  Leave your suitcase in the car.  After surgery it may be brought to your room.  For patients admitted to the hospital, discharge time will be determined by your treatment team.  Patients discharged the day of surgery will not be allowed to drive home and must have someone with them for 24 hours.    Special instructions:   DO NOT smoke tobacco or vape for 24 hours before your procedure.  Please read over the following fact sheets that you were given. Pain Booklet, Coughing and Deep Breathing, Surgical Site Infection Prevention, Anesthesia Post-op Instructions, and Care and Recovery After Surgery      Laparoscopic Surgery for Belly Hernias: What to Know After After the procedure, it's common to have pain, discomfort, or soreness. Follow these instructions at home: Medicines Take your medicines only as told. You may need to take steps to help treat or  prevent trouble pooping (constipation), such as: Taking medicines to help you poop. Eating foods high in fiber, like beans, whole grains, and fresh fruits and vegetables. Drinking more fluids as told. Ask your health care provider if it's safe to drive or use machines while taking your medicine. Incision care  Take care of the cuts in your belly as told. Make sure you: Wash your hands with soap and water for at least 20 seconds before and after you change your bandage. If you can't use soap and water, use hand sanitizer. Change your bandage. Leave stitches or skin glue alone. Leave tape strips alone unless you're told to take them off. You may trim the edges of the tape strips if they curl up. Check the cuts on your belly every day for signs of infection. Check for: More redness, swelling, or pain. More fluid or blood. Warmth. Pus or a bad smell. Activity Rest as told. Get up to take short walks at least every 2 hours during the day. This helps you breathe better and keeps your blood flowing. Ask for help if you feel weak or unsteady. Do not take baths, swim, or use  a hot tub until you're told it's OK. Ask if you can shower. Ask if it's OK for you to lift. If you were given a sedative, do not drive or use machines until you're told it's safe. A sedative can make you sleepy. Ask what things are safe for you to do at home. Ask when you can go back to work or school. General instructions Hold a pillow over your belly when you cough or sneeze. This helps with pain. Wear a binder around your belly as told by your provider. Do not smoke, vape, or use nicotine or tobacco. Wear compression stockings to reduce swelling and help prevent blood clots in your legs. You may be asked to continue to do deep breathing exercises at home. This will help to prevent a lung infection. Contact a health care provider if: You have any signs of infection. You have pain that gets worse or does not get better  with medicine. You throw up or you feel like throwing up. You have a cough. You have not pooped in 3 days. You are not able to pee. You have a fever. Get help right away if: You have very bad pain in your belly. You throw up every time you eat or drink. You have redness, warmth, or pain in your leg. You have chest pain. You have trouble breathing. These symptoms may be an emergency. Call 911 right away. Do not wait to see if the symptoms will go away. Do not drive yourself to the hospital. This information is not intended to replace advice given to you by your health care provider. Make sure you discuss any questions you have with your health care provider. Document Revised: 07/19/2022 Document Reviewed: 07/19/2022 Elsevier Patient Education  2024 Elsevier Inc. General Anesthesia, Adult, Care After The following information offers guidance on how to care for yourself after your procedure. Your health care provider may also give you more specific instructions. If you have problems or questions, contact your health care provider. What can I expect after the procedure? After the procedure, it is common for people to: Have pain or discomfort at the IV site. Have nausea or vomiting. Have a sore throat or hoarseness. Have trouble concentrating. Feel cold or chills. Feel weak, sleepy, or tired (fatigue). Have soreness and body aches. These can affect parts of the body that were not involved in surgery. Follow these instructions at home: For the time period you were told by your health care provider:  Rest. Do not participate in activities where you could fall or become injured. Do not drive or use machinery. Do not drink alcohol. Do not take sleeping pills or medicines that cause drowsiness. Do not make important decisions or sign legal documents. Do not take care of children on your own. General instructions Drink enough fluid to keep your urine pale yellow. If you have sleep  apnea, surgery and certain medicines can increase your risk for breathing problems. Follow instructions from your health care provider about wearing your sleep device: Anytime you are sleeping, including during daytime naps. While taking prescription pain medicines, sleeping medicines, or medicines that make you drowsy. Return to your normal activities as told by your health care provider. Ask your health care provider what activities are safe for you. Take over-the-counter and prescription medicines only as told by your health care provider. Do not use any products that contain nicotine or tobacco. These products include cigarettes, chewing tobacco, and vaping devices, such as e-cigarettes. These can delay incision  healing after surgery. If you need help quitting, ask your health care provider. Contact a health care provider if: You have nausea or vomiting that does not get better with medicine. You vomit every time you eat or drink. You have pain that does not get better with medicine. You cannot urinate or have bloody urine. You develop a skin rash. You have a fever. Get help right away if: You have trouble breathing. You have chest pain. You vomit blood. These symptoms may be an emergency. Get help right away. Call 911. Do not wait to see if the symptoms will go away. Do not drive yourself to the hospital. Summary After the procedure, it is common to have a sore throat, hoarseness, nausea, vomiting, or to feel weak, sleepy, or fatigue. For the time period you were told by your health care provider, do not drive or use machinery. Get help right away if you have difficulty breathing, have chest pain, or vomit blood. These symptoms may be an emergency. This information is not intended to replace advice given to you by your health care provider. Make sure you discuss any questions you have with your health care provider. Document Revised: 04/09/2021 Document Reviewed: 04/09/2021 Elsevier  Patient Education  2024 Elsevier Inc.How to Use Chlorhexidine at Home in the Shower Chlorhexidine gluconate (CHG) is a germ-killing (antiseptic) wash that's used to clean the skin. It can get rid of the germs that normally live on the skin and can keep them away for about 24 hours. If you're having surgery, you may be told to shower with CHG at home the night before surgery. This can help lower your risk for infection. To use CHG wash in the shower, follow the steps below. Supplies needed: CHG body wash. Clean washcloth. Clean towel. How to use CHG in the shower Follow these steps unless you're told to use CHG in a different way: Start the shower. Use your normal soap and shampoo to wash your face and hair. Turn off the shower or move out of the shower stream. Pour CHG onto a clean washcloth. Do not use any type of brush or rough sponge. Start at your neck, washing your body down to your toes. Make sure you: Wash the part of your body where the surgery will be done for at least 1 minute. Do not scrub. Do not use CHG on your head or face unless your health care provider tells you to. If it gets into your ears or eyes, rinse them well with water. Do not wash your genitals with CHG. Wash your back and under your arms. Make sure to wash skin folds. Let the CHG sit on your skin for 1-2 minutes or as long as told. Rinse your entire body in the shower, including all body creases and folds. Turn off the shower. Dry off with a clean towel. Do not put anything on your skin afterward, such as powder, lotion, or perfume. Put on clean clothes or pajamas. If it's the night before surgery, sleep in clean sheets. General tips Use CHG only as told, and follow the instructions on the label. Use the full amount of CHG as told. This is often one bottle. Do not smoke and stay away from flames after using CHG. Your skin may feel sticky after using CHG. This is normal. The sticky feeling will go away as the  CHG dries. Do not use CHG: If you have a chlorhexidine allergy or have reacted to chlorhexidine in the past. On open wounds  or areas of skin that have broken skin, cuts, or scrapes. On babies younger than 58 months of age. Contact a health care provider if: You have questions about using CHG. Your skin gets irritated or itchy. You have a rash after using CHG. You swallow any CHG. Call your local poison control center [phone] in the [place] Your eyes itch badly, or they become very red or swollen. Your hearing changes. You have trouble seeing. If you can't reach your provider, go to an urgent care or emergency room. Do not drive yourself. Get help right away if: You have swelling or tingling in your mouth or throat. You make high-pitched whistling sounds when you breathe, most often when you breathe out (wheeze). You have trouble breathing. These symptoms may be an emergency. Call 911 right away. Do not wait to see if the symptoms will go away. Do not drive yourself to the hospital. This information is not intended to replace advice given to you by your health care provider. Make sure you discuss any questions you have with your health care provider. Document Revised: 07/26/2022 Document Reviewed: 07/22/2021 Elsevier Patient Education  2024 [employer]. "

## 2024-01-30 ENCOUNTER — Other Ambulatory Visit: Payer: Self-pay

## 2024-01-30 ENCOUNTER — Encounter (HOSPITAL_COMMUNITY)
Admission: RE | Admit: 2024-01-30 | Discharge: 2024-01-30 | Disposition: A | Discharge status: Home / Self Care | Source: Ambulatory Visit | Attending: Surgery | Admitting: Surgery

## 2024-01-30 ENCOUNTER — Encounter (HOSPITAL_COMMUNITY): Payer: Self-pay

## 2024-01-30 VITALS — BP 147/88 | HR 59 | Resp 18 | Ht 69.0 in | Wt 226.0 lb

## 2024-01-30 DIAGNOSIS — Z01818 Encounter for other preprocedural examination: Secondary | ICD-10-CM | POA: Insufficient documentation

## 2024-01-30 DIAGNOSIS — I213 ST elevation (STEMI) myocardial infarction of unspecified site: Secondary | ICD-10-CM | POA: Insufficient documentation

## 2024-01-30 DIAGNOSIS — I10 Essential (primary) hypertension: Secondary | ICD-10-CM

## 2024-01-30 DIAGNOSIS — Z01810 Encounter for preprocedural cardiovascular examination: Secondary | ICD-10-CM | POA: Diagnosis present

## 2024-01-30 DIAGNOSIS — Z01812 Encounter for preprocedural laboratory examination: Secondary | ICD-10-CM | POA: Diagnosis present

## 2024-01-30 HISTORY — DX: Personal history of urinary calculi: Z87.442

## 2024-01-30 LAB — CBC WITH DIFFERENTIAL/PLATELET
Abs Immature Granulocytes: 0.01 K/uL (ref 0.00–0.07)
Basophils Absolute: 0.1 K/uL (ref 0.0–0.1)
Basophils Relative: 1 %
Eosinophils Absolute: 0.2 K/uL (ref 0.0–0.5)
Eosinophils Relative: 5 %
HCT: 41.5 % (ref 39.0–52.0)
Hemoglobin: 13.9 g/dL (ref 13.0–17.0)
Immature Granulocytes: 0 %
Lymphocytes Relative: 25 %
Lymphs Abs: 0.9 K/uL (ref 0.7–4.0)
MCH: 30.3 pg (ref 26.0–34.0)
MCHC: 33.5 g/dL (ref 30.0–36.0)
MCV: 90.6 fL (ref 80.0–100.0)
Monocytes Absolute: 0.4 K/uL (ref 0.1–1.0)
Monocytes Relative: 11 %
Neutro Abs: 2.2 K/uL (ref 1.7–7.7)
Neutrophils Relative %: 58 %
Platelets: 251 K/uL (ref 150–400)
RBC: 4.58 MIL/uL (ref 4.22–5.81)
RDW: 13.9 % (ref 11.5–15.5)
WBC: 3.7 K/uL — ABNORMAL LOW (ref 4.0–10.5)
nRBC: 0 % (ref 0.0–0.2)

## 2024-01-30 LAB — BASIC METABOLIC PANEL WITH GFR
Anion gap: 15 (ref 5–15)
BUN: 24 mg/dL — ABNORMAL HIGH (ref 6–20)
CO2: 19 mmol/L — ABNORMAL LOW (ref 22–32)
Calcium: 9.3 mg/dL (ref 8.9–10.3)
Chloride: 108 mmol/L (ref 98–111)
Creatinine, Ser: 1.25 mg/dL — ABNORMAL HIGH (ref 0.61–1.24)
GFR, Estimated: >60 mL/min
Glucose, Bld: 84 mg/dL (ref 70–99)
Potassium: 3.8 mmol/L (ref 3.5–5.1)
Sodium: 142 mmol/L (ref 135–145)

## 2024-02-01 ENCOUNTER — Ambulatory Visit (HOSPITAL_COMMUNITY)
Admission: RE | Admit: 2024-02-01 | Discharge: 2024-02-01 | Disposition: A | Discharge status: Home / Self Care | Attending: Surgery | Admitting: Surgery

## 2024-02-01 ENCOUNTER — Encounter (HOSPITAL_COMMUNITY)
Admission: RE | Disposition: A | Discharge status: Home / Self Care | Payer: Self-pay | Source: Home / Self Care | Attending: Surgery

## 2024-02-01 ENCOUNTER — Ambulatory Visit (HOSPITAL_COMMUNITY): Payer: Self-pay | Admitting: Certified Registered"

## 2024-02-01 ENCOUNTER — Other Ambulatory Visit: Payer: Self-pay

## 2024-02-01 ENCOUNTER — Encounter (HOSPITAL_COMMUNITY): Payer: Self-pay | Admitting: Surgery

## 2024-02-01 DIAGNOSIS — E785 Hyperlipidemia, unspecified: Secondary | ICD-10-CM | POA: Insufficient documentation

## 2024-02-01 DIAGNOSIS — I10 Essential (primary) hypertension: Secondary | ICD-10-CM | POA: Insufficient documentation

## 2024-02-01 DIAGNOSIS — K420 Umbilical hernia with obstruction, without gangrene: Secondary | ICD-10-CM

## 2024-02-01 SURGERY — REPAIR, HERNIA, UMBILICAL, ROBOT-ASSISTED
Anesthesia: General | Site: Abdomen

## 2024-02-01 MED ORDER — CHLORHEXIDINE GLUCONATE CLOTH 2 % EX PADS: 6.0000 | MEDICATED_PAD | Freq: Once | CUTANEOUS | Status: DC

## 2024-02-01 MED ORDER — BUPIVACAINE HCL (PF) 0.5 % IJ SOLN
INTRAMUSCULAR | Status: DC | PRN
  Administered 2024-02-01: 30 mL

## 2024-02-01 MED ORDER — DEXAMETHASONE SOD PHOSPHATE PF 10 MG/ML IJ SOLN
INTRAMUSCULAR | Status: AC
  Filled 2024-02-01: qty 2

## 2024-02-01 MED ORDER — FENTANYL CITRATE (PF) 100 MCG/2ML IJ SOLN
INTRAMUSCULAR | Status: AC
  Filled 2024-02-01: qty 2

## 2024-02-01 MED ORDER — PROPOFOL 10 MG/ML IV BOLUS
INTRAVENOUS | Status: AC
  Filled 2024-02-01: qty 20

## 2024-02-01 MED ORDER — LABETALOL HCL 5 MG/ML IV SOLN
INTRAVENOUS | Status: AC
  Filled 2024-02-01: qty 4

## 2024-02-01 MED ORDER — ONDANSETRON HCL 4 MG/2ML IJ SOLN: 4.0000 mg | Freq: Once | INTRAMUSCULAR | Status: DC | PRN

## 2024-02-01 MED ORDER — STERILE WATER FOR IRRIGATION IR SOLN
Status: DC | PRN
  Administered 2024-02-01: 1000 mL

## 2024-02-01 MED ORDER — DEXMEDETOMIDINE HCL IN NACL 80 MCG/20ML IV SOLN
INTRAVENOUS | Status: DC | PRN
  Administered 2024-02-01: 12 ug via INTRAVENOUS

## 2024-02-01 MED ORDER — ONDANSETRON HCL 4 MG/2ML IJ SOLN
INTRAMUSCULAR | Status: AC
  Filled 2024-02-01: qty 4

## 2024-02-01 MED ORDER — ROCURONIUM BROMIDE 10 MG/ML (PF) SYRINGE
PREFILLED_SYRINGE | INTRAVENOUS | Status: AC
  Filled 2024-02-01: qty 10

## 2024-02-01 MED ORDER — FENTANYL CITRATE (PF) 100 MCG/2ML IJ SOLN
INTRAMUSCULAR | Status: DC | PRN
  Administered 2024-02-01: 50 ug via INTRAVENOUS
  Administered 2024-02-01: 100 ug via INTRAVENOUS
  Administered 2024-02-01: 50 ug via INTRAVENOUS

## 2024-02-01 MED ORDER — ACETAMINOPHEN 500 MG PO TABS: 1000.0000 mg | ORAL_TABLET | Freq: Four times a day (QID) | ORAL | 0 refills | Status: AC

## 2024-02-01 MED ORDER — OXYCODONE HCL 5 MG/5ML PO SOLN
5.0000 mg | Freq: Once | ORAL | Status: AC | PRN
  Administered 2024-02-01: 5 mg via ORAL
  Filled 2024-02-01: qty 5

## 2024-02-01 MED ORDER — LIDOCAINE 2% (20 MG/ML) 5 ML SYRINGE
INTRAMUSCULAR | Status: AC
  Filled 2024-02-01: qty 5

## 2024-02-01 MED ORDER — OXYCODONE HCL 5 MG PO TABS: 5.0000 mg | ORAL_TABLET | Freq: Four times a day (QID) | ORAL | 0 refills | Status: DC | PRN

## 2024-02-01 MED ORDER — HYDROMORPHONE HCL 1 MG/ML IJ SOLN
INTRAMUSCULAR | Status: DC | PRN
  Administered 2024-02-01: .5 mg via INTRAVENOUS

## 2024-02-01 MED ORDER — SUGAMMADEX SODIUM 200 MG/2ML IV SOLN
INTRAVENOUS | Status: DC | PRN
  Administered 2024-02-01: 200 mg via INTRAVENOUS

## 2024-02-01 MED ORDER — PROPOFOL 10 MG/ML IV BOLUS
INTRAVENOUS | Status: DC | PRN
  Administered 2024-02-01: 200 mg via INTRAVENOUS

## 2024-02-01 MED ORDER — BUPIVACAINE HCL (PF) 0.5 % IJ SOLN
INTRAMUSCULAR | Status: AC
  Filled 2024-02-01: qty 30

## 2024-02-01 MED ORDER — ONDANSETRON HCL 4 MG PO TABS: 4.0000 mg | ORAL_TABLET | Freq: Every day | ORAL | 1 refills | Status: DC | PRN

## 2024-02-01 MED ORDER — ROCURONIUM BROMIDE 10 MG/ML (PF) SYRINGE
PREFILLED_SYRINGE | INTRAVENOUS | Status: DC | PRN
  Administered 2024-02-01: 70 mg via INTRAVENOUS
  Administered 2024-02-01: 20 mg via INTRAVENOUS

## 2024-02-01 MED ORDER — ASPIRIN 325 MG PO TABS: 325.0000 mg | ORAL_TABLET | Freq: Every day | ORAL | Status: AC | PRN

## 2024-02-01 MED ORDER — CHLORHEXIDINE GLUCONATE 0.12 % MT SOLN
15.0000 mL | Freq: Once | OROMUCOSAL | Status: AC
  Administered 2024-02-01: 15 mL via OROMUCOSAL

## 2024-02-01 MED ORDER — CHLORHEXIDINE GLUCONATE 0.12 % MT SOLN
OROMUCOSAL | Status: AC
  Filled 2024-02-01: qty 15

## 2024-02-01 MED ORDER — OXYCODONE HCL 5 MG PO TABS: 5.0000 mg | ORAL_TABLET | Freq: Once | ORAL | Status: AC | PRN

## 2024-02-01 MED ORDER — MIDAZOLAM HCL 2 MG/2ML IJ SOLN
INTRAMUSCULAR | Status: AC
  Filled 2024-02-01: qty 2

## 2024-02-01 MED ORDER — DOCUSATE SODIUM 100 MG PO CAPS: 100.0000 mg | ORAL_CAPSULE | Freq: Two times a day (BID) | ORAL | 2 refills | Status: DC

## 2024-02-01 MED ORDER — LACTATED RINGERS IV SOLN: INTRAVENOUS | Status: DC

## 2024-02-01 MED ORDER — DEXMEDETOMIDINE HCL IN NACL 80 MCG/20ML IV SOLN
INTRAVENOUS | Status: AC
  Filled 2024-02-01: qty 20

## 2024-02-01 MED ORDER — CEFAZOLIN SODIUM-DEXTROSE 2-4 GM/100ML-% IV SOLN
INTRAVENOUS | Status: AC
  Filled 2024-02-01: qty 100

## 2024-02-01 MED ORDER — MIDAZOLAM HCL 5 MG/5ML IJ SOLN
INTRAMUSCULAR | Status: DC | PRN
  Administered 2024-02-01: 2 mg via INTRAVENOUS

## 2024-02-01 MED ORDER — CEFAZOLIN SODIUM-DEXTROSE 2-4 GM/100ML-% IV SOLN
2.0000 g | INTRAVENOUS | Status: AC
  Administered 2024-02-01: 2 g via INTRAVENOUS
  Filled 2024-02-01: qty 100

## 2024-02-01 MED ORDER — ONDANSETRON HCL 4 MG/2ML IJ SOLN
INTRAMUSCULAR | Status: DC | PRN
  Administered 2024-02-01: 4 mg via INTRAVENOUS

## 2024-02-01 MED ORDER — FENTANYL CITRATE (PF) 50 MCG/ML IJ SOSY
25.0000 ug | PREFILLED_SYRINGE | INTRAMUSCULAR | Status: DC | PRN
  Administered 2024-02-01 (×2): 50 ug via INTRAVENOUS
  Filled 2024-02-01 (×2): qty 1

## 2024-02-01 MED ORDER — HYDROMORPHONE HCL 1 MG/ML IJ SOLN
INTRAMUSCULAR | Status: AC
  Filled 2024-02-01: qty 0.5

## 2024-02-01 MED ORDER — ORAL CARE MOUTH RINSE: 15.0000 mL | Freq: Once | OROMUCOSAL | Status: AC

## 2024-02-01 MED ORDER — DEXAMETHASONE SOD PHOSPHATE PF 10 MG/ML IJ SOLN
INTRAMUSCULAR | Status: DC | PRN
  Administered 2024-02-01: 10 mg via INTRAVENOUS

## 2024-02-01 MED ORDER — LIDOCAINE 2% (20 MG/ML) 5 ML SYRINGE
INTRAMUSCULAR | Status: DC | PRN
  Administered 2024-02-01: 100 mg via INTRAVENOUS

## 2024-02-01 SURGICAL SUPPLY — 40 items
BLADE SURG 15 STRL LF DISP TIS (BLADE) ×1 IMPLANT
CHLORAPREP W/TINT 26 (MISCELLANEOUS) ×1 IMPLANT
COVER LIGHT HANDLE (MISCELLANEOUS) IMPLANT
COVER MAYO STAND XLG (MISCELLANEOUS) ×1 IMPLANT
COVER TIP SHEARS 8 DVNC (MISCELLANEOUS) ×1 IMPLANT
DEFOGGER SCOPE WARM SEASHARP (MISCELLANEOUS) ×1 IMPLANT
DERMABOND ADVANCED .7 DNX12 (GAUZE/BANDAGES/DRESSINGS) ×1 IMPLANT
DRAPE ARM DVNC X/XI (DISPOSABLE) ×3 IMPLANT
DRAPE COLUMN DVNC XI (DISPOSABLE) ×1 IMPLANT
DRIVER NDLE MEGA SUTCUT DVNCXI (INSTRUMENTS) ×1 IMPLANT
DRSG TEGADERM 4X4.75 (GAUZE/BANDAGES/DRESSINGS) IMPLANT
ELECTRODE REM PT RTRN 9FT ADLT (ELECTROSURGICAL) ×1 IMPLANT
FORCEPS BPLR 8 MD DVNC XI (FORCEP) ×1 IMPLANT
GAUZE SPONGE 2X2 STRL 8-PLY (GAUZE/BANDAGES/DRESSINGS) IMPLANT
GLOVE BIOGEL PI IND STRL 6.5 (GLOVE) ×2 IMPLANT
GLOVE BIOGEL PI IND STRL 7.0 (GLOVE) ×2 IMPLANT
GLOVE SURG SS PI 6.5 STRL IVOR (GLOVE) ×2 IMPLANT
GOWN STRL REUS W/TWL LRG LVL3 (GOWN DISPOSABLE) ×3 IMPLANT
GRASPER SUT TROCAR 14GX15 (MISCELLANEOUS) ×1 IMPLANT
KIT TURNOVER KIT A (KITS) ×1 IMPLANT
MANIFOLD NEPTUNE II (INSTRUMENTS) ×1 IMPLANT
MESH VENTRALIGHT ST 4.5IN (Mesh General) IMPLANT
NEEDLE HYPO 21X1.5 SAFETY (NEEDLE) ×1 IMPLANT
NEEDLE INSUFFLATION 14GA 120MM (NEEDLE) ×1 IMPLANT
OBTURATOR OPTICALSTD 8 DVNC (TROCAR) ×1 IMPLANT
PACK LAP CHOLE LZT030E (CUSTOM PROCEDURE TRAY) ×1 IMPLANT
PENCIL HANDSWITCHING (ELECTRODE) ×1 IMPLANT
POSITIONER HEAD 8X9X4 ADT (SOFTGOODS) ×1 IMPLANT
SCISSORS MNPLR CVD DVNC XI (INSTRUMENTS) ×1 IMPLANT
SEAL UNIV 5-12 XI (MISCELLANEOUS) ×3 IMPLANT
SET BASIN LINEN APH (SET/KITS/TRAYS/PACK) ×1 IMPLANT
SET TUBE SMOKE EVAC HIGH FLOW (TUBING) ×1 IMPLANT
SOLN STERILE WATER 500 ML (IV SOLUTION) ×1 IMPLANT
SUT MNCRL AB 4-0 PS2 18 (SUTURE) ×1 IMPLANT
SUT STRATA 3-0 SH (SUTURE) ×2 IMPLANT
SUT VICRYL 0 UR6 27IN ABS (SUTURE) IMPLANT
SUTURE STRATFX 0 PDS+ CT-2 23 (SUTURE) ×1 IMPLANT
SYR 30ML LL (SYRINGE) ×1 IMPLANT
TAPE TRANSPORE STRL 2 31045 (GAUZE/BANDAGES/DRESSINGS) ×1 IMPLANT
TRAY FOLEY W/BAG SLVR 16FR ST (SET/KITS/TRAYS/PACK) ×1 IMPLANT

## 2024-02-01 NOTE — Transfer of Care (Signed)
 Immediate Anesthesia Transfer of Care Note  Patient: Matthew Weber  Procedure(s) Performed: REPAIR, HERNIA, UMBILICAL, ROBOT-ASSISTED (Abdomen)  Patient Location: PACU  Anesthesia Type:General  Level of Consciousness: awake, alert , oriented, patient cooperative, and responds to stimulation  Airway & Oxygen Therapy: Patient Spontanous Breathing  Post-op Assessment: Report given to RN and Post -op Vital signs reviewed and stable  Post vital signs: Reviewed and stable  Last Vitals:  Vitals Value Taken Time  BP 168/95 02/01/24 09:40  Temp 98.4   Pulse 67 02/01/24 09:44  Resp 11 02/01/24 09:41  SpO2 96 % 02/01/24 09:44  Vitals shown include unfiled device data.  Last Pain: There were no vitals filed for this visit.       Complications: No notable events documented.

## 2024-02-01 NOTE — Anesthesia Procedure Notes (Signed)
 Procedure Name: Intubation Date/Time: 02/01/2024 7:41 AM  Performed by: Con Fitch, RNPre-anesthesia Checklist: Patient identified, Emergency Drugs available, Suction available, Patient being monitored and Timeout performed Patient Re-evaluated:Patient Re-evaluated prior to induction Oxygen Delivery Method: Circle system utilized Preoxygenation: Pre-oxygenation with 100% oxygen Induction Type: IV induction Ventilation: Mask ventilation without difficulty Laryngoscope Size: Mac and 3 Grade View: Grade II Tube type: Oral Tube size: 7.5 mm Number of attempts: 1 Airway Equipment and Method: Stylet and Oral airway Placement Confirmation: ETT inserted through vocal cords under direct vision, positive ETCO2 and breath sounds checked- equal and bilateral Secured at: 23 cm Tube secured with: Tape Dental Injury: Teeth and Oropharynx as per pre-operative assessment  Comments: Dl by srna

## 2024-02-01 NOTE — Progress Notes (Signed)
 [facility]  Spoke with the patient's wife in the consultation room.  I explained that he tolerated the procedure without difficulty.  He has dissolvable stitches under the skin with overlying skin glue.  This will flake off in 10 to 14 days.  I discharged him home with a prescription for narcotic pain medication that they should take as needed for pain.  I also want him taking scheduled Tylenol.  If they take the narcotic pain medication, they should take a stool softener as well.  The patient will follow-up with me in 2 weeks.  All questions were answered to her expressed satisfaction.  Theophilus Kinds, DO [facility] [address] [place], [place] [address] [phone] (office)

## 2024-02-01 NOTE — Discharge Instructions (Signed)
 Ambulatory Surgery Discharge Instructions  General Anesthesia or Sedation Do not drive or operate heavy machinery for 24 hours.  Do not consume alcohol, tranquilizers, sleeping medications, or any non-prescribed medications for 24 hours. Do not make important decisions or sign any important papers in the next 24 hours. You should have someone with you tonight at home.  Activity  You are advised to go directly home from the hospital.  Restrict your activities and rest for a day.  Resume light activity tomorrow. No heavy lifting over 10 lbs or strenuous exercise. Wear your abdominal binder at all times except while sleeping and bathing for the next 2 weeks.  Fluids and Diet Begin with clear liquids, bouillon, dry toast, soda crackers.  If not nauseated, you may go to a regular diet when you desire.  Greasy and spicy foods are not advised.  Medications  If you have not had a bowel movement in 24 hours, take 2 tablespoons over the counter Milk of mag.             You May resume your blood thinners tomorrow (Aspirin, coumadin, or other).  You are being discharged with prescriptions for Opioid/Narcotic Medications: There are some specific considerations for these medications that you should know. Opioid Meds have risks & benefits. Addiction to these meds is always a concern with prolonged use Take medication only as directed Do not drive while taking narcotic pain medication Do not crush tablets or capsules Do not use a different container than medication was dispensed in Lock the container of medication in a cool, dry place out of reach of children and pets. Opioid medication can cause addiction Do not share with anyone else (this is a felony) Do not store medications for future use. Dispose of them properly.     Disposal:  Find a [employer] household drug take back site near you.  If you can't get to a drug take back site, use the recipe below as a last resort to dispose of expired,  unused or unwanted drugs. Disposal  (Do not dispose chemotherapy drugs this way, talk to your prescribing doctor instead.) Step 1: Mix drugs (do not crush) with dirt, kitty litter, or used coffee grounds and add a small amount of water to dissolve any solid medications. Step 2: Seal drugs in plastic bag. Step 3: Place plastic bag in trash. Step 4: Take prescription container and scratch out personal information, then recycle or throw away.  Operative Site  You have a liquid bandage over your incisions, this will begin to flake off in about a week. Ok to English as a second language teacher. Keep wound clean and dry. No baths or swimming. No lifting more than 10 pounds.  Contact Information: If you have questions or concerns, please call our office, [phone], Monday- Thursday 8AM-5PM and Friday 8AM-12Noon.  If it is after hours or on the weekend, please call [facility] Main Number, [phone], and ask to speak to the surgeon on call for Dr. Robyne Peers at [facility].   SPECIFIC COMPLICATIONS TO WATCH FOR: Inability to urinate Fever over 101? F by mouth Nausea and vomiting lasting longer than 24 hours. Pain not relieved by medication ordered Swelling around the operative site Increased redness, warmth, hardness, around operative area Numbness, tingling, or cold fingers or toes Blood -soaked dressing, (small amounts of oozing may be normal) Increasing and progressive drainage from surgical area or exam site

## 2024-02-01 NOTE — Interval H&P Note (Signed)
 History and Physical Interval Note:  02/01/2024 7:19 AM  Matthew Weber  has presented today for surgery, with the diagnosis of HERNIA, UMBILICAL 1 CM.  The various methods of treatment have been discussed with the patient and family. After consideration of risks, benefits and other options for treatment, the patient has consented to  Procedures with comments: REPAIR, HERNIA, UMBILICAL, ROBOT-ASSISTED (N/A) - W/ MESH as a surgical intervention.  The patient's history has been reviewed, patient examined, no change in status, stable for surgery.  I have reviewed the patient's chart and labs.  Questions were answered to the patient's satisfaction.     Javyon Fontan A Jeremian Whitby

## 2024-02-01 NOTE — Op Note (Addendum)
 [facility] Operative Note  02/01/2024  Preoperative Diagnosis: Incarcerated umbilical hernia    Postoperative Diagnosis: Incarcerated umbilical hernia, 3 cm   Procedure(s) Performed: Robotic assisted laparoscopic umbilical hernia repair with mesh, defect size 3 cm   Surgeon: Dorothyann Brittle, DO   Assistants: No qualified resident was available    Anesthesia: General endotracheal   Anesthesiologist: Kendell Yvonna PARAS, MD    Specimens: None   Estimated Blood Loss: Minimal   Blood Replacement: None    Complications: None   Wound Class: Clean   Operative Indications: Patient is a 59 year old male who presents for umbilical hernia repair.  He has had it for years, but it just started bothering him recently, and he would like it repaired at this time.  He is agreeable to surgery.  All risks and benefits of performing this procedure were discussed with the patient including pain, infection, bleeding, damage to the surrounding structures, need for more procedures or surgery, and recurrent hernia. The patient voiced understanding of the procedure, all questions were sought and answered, and consent was obtained.  Findings: 3 cm umbilical hernia defect Tension free repair achieved with 4.5 inch Ventralight mesh and suture Adequate hemostasis    Procedure: The patient was brought to the operating room and general anesthesia was induced. A time-out was completed verifying correct patient, procedure, site, positioning, and implant(s) and/or special equipment prior to beginning this procedure. Antibiotics were administered prior to making the incision. SCDs placed. The anterior abdominal wall was prepped and draped in the standard sterile fashion.   A stab incision was made at Palmer's point.  A towel clip was used to elevate the abdominal wall. A Veress needle placed and the saline drop test was performed. The abdomen was insufflated to 15cm without issues. 8 mm port  was then placed at the Palmer's point incision using the optiview technique.  No injuries were noted from insufflation and port insertion.  2 additional lateral ports on the left side were placed using the optiview ports, placing the 8mm central camera port and an additional 8mm port on in the left lower quadrant with adequate space between.  The [place] robot then docked into place.  The hernia was noted to be umbilical hernia. The hernia contents noted and reduced with combination of blunt, sharp dissection with scissors and fenestrated forceps.  Hemostasis achieved throughout this portion.  Once all hernia contents reduced, there was noted to be a 3 cm umbilical hernia defect.  Any fat overlying the peritoneal layer was taken down creating flaps to clear the space for adequate fixation of the mesh.  The excess fat was removed from the anterior abdominal wall.  The 4.5 inch Ventralight mesh and sutures were placed in the abdominal cavity under direct visualization.  A transfacial suture with 0 stratafix used to primarily close defect under minimal tension. The stratafix was then passed through the center of the mesh (coated side out) to secured the mesh to the abdominal wall centered over the defect.  The mesh was then circumferentially sutured into the anterior abdominal wall using 3-0 stratafix x3.  Any bleeding noted during this portion was no longer actively bleeding by end of securing mesh and tightening the suture.    The robot was undocked.  The excess fat was placed in an Endocatch bag and removed through the Palmer's point incision site.  The abdomen was then de-sufflated and the ports were removed.  The fascia of the Palmer's point incision site was  closed with 0 Vicryl with a PMI needle.  All skin incisions closed with subcuticular 4-0 Monocryl and dermabond. Final inspection revealed acceptable hemostasis.   All counts were correct at the end of the case. The patient was awakened from anesthesia and  extubated without complication.  The patient went to the PACU in stable condition.   Dorothyann Brittle, DO [facility] [address] [place], [place] [address] [phone] (office)

## 2024-02-01 NOTE — Anesthesia Preprocedure Evaluation (Signed)
"                                    Anesthesia Evaluation  Patient identified by MRN, date of birth, ID band Patient awake    Reviewed: Allergy & Precautions, H&P , NPO status , Patient's Chart, lab work & pertinent test results, reviewed documented beta blocker date and time   Airway Mallampati: II  TM Distance: >3 FB Neck ROM: full    Dental no notable dental hx.    Pulmonary neg pulmonary ROS   Pulmonary exam normal breath sounds clear to auscultation       Cardiovascular Exercise Tolerance: Good hypertension,  Rhythm:regular Rate:Normal     Neuro/Psych negative neurological ROS  negative psych ROS   GI/Hepatic Neg liver ROS,GERD  ,,  Endo/Other  negative endocrine ROS    Renal/GU negative Renal ROS  negative genitourinary   Musculoskeletal   Abdominal   Peds  Hematology negative hematology ROS (+)   Anesthesia Other Findings   Reproductive/Obstetrics negative OB ROS                              Anesthesia Physical Anesthesia Plan  ASA: 2  Anesthesia Plan: General and General ETT   Post-op Pain Management:    Induction:   PONV Risk Score and Plan: Ondansetron   Airway Management Planned:   Additional Equipment:   Intra-op Plan:   Post-operative Plan:   Informed Consent: I have reviewed the patients History and Physical, chart, labs and discussed the procedure including the risks, benefits and alternatives for the proposed anesthesia with the patient or authorized representative who has indicated his/her understanding and acceptance.     Dental Advisory Given  Plan Discussed with: CRNA  Anesthesia Plan Comments:         Anesthesia Quick Evaluation  "

## 2024-02-02 DIAGNOSIS — K420 Umbilical hernia with obstruction, without gangrene: Secondary | ICD-10-CM

## 2024-02-03 NOTE — Anesthesia Postprocedure Evaluation (Signed)
"   Anesthesia Post Note  Patient: Devondre Guzzetta  Procedure(s) Performed: REPAIR, HERNIA, UMBILICAL, ROBOT-ASSISTED (Abdomen)  Patient location during evaluation: Phase II Anesthesia Type: General Level of consciousness: awake Pain management: pain level controlled Vital Signs Assessment: post-procedure vital signs reviewed and stable Respiratory status: spontaneous breathing and respiratory function stable Cardiovascular status: blood pressure returned to baseline and stable Postop Assessment: no headache and no apparent nausea or vomiting Anesthetic complications: no Comments: Late entry   No notable events documented.   Last Vitals:  Vitals:   02/01/24 1045 02/01/24 1057  BP: (!) 152/97 (!) 152/97  Pulse: 64 68  Resp: 20 15  Temp:  36.7 C  SpO2: 93% 92%    Last Pain:  Vitals:   02/02/24 1340  TempSrc:   PainSc: 2                  Yvonna PARAS Jaivon Vanbeek      "

## 2024-02-08 ENCOUNTER — Telehealth: Payer: Self-pay | Admitting: Family Medicine

## 2024-02-08 NOTE — Telephone Encounter (Signed)
 [employer]D Paperwork completed and faxed to [employer] at [phone]. Confirmation Received.  Out of work starting 02/01/2024 and may return unrestricted on 03/18/2024.

## 2024-02-14 ENCOUNTER — Ambulatory Visit: Admitting: Surgery

## 2024-02-14 ENCOUNTER — Encounter: Payer: Self-pay | Admitting: Surgery

## 2024-02-14 ENCOUNTER — Other Ambulatory Visit: Payer: Self-pay

## 2024-02-14 VITALS — BP 150/92 | HR 71 | Temp 97.6°F | Resp 18 | Ht 69.0 in | Wt 224.0 lb

## 2024-02-14 DIAGNOSIS — Z09 Encounter for follow-up examination after completed treatment for conditions other than malignant neoplasm: Secondary | ICD-10-CM

## 2024-02-14 NOTE — Progress Notes (Signed)
"      [facility] Note   HPI:  59 y.o. Male presents to clinic for post-op follow-up status post robotic assisted laparoscopic umbilical hernia repair with mesh on 02/01/2024.  Patient overall doing well since his surgery.  He initially had a fair amount of pain, but this has continued to slowly improve.  He is currently only taking intermittent Tylenol  and Advil, and he believes he could probably stop these medications.  He is tolerating a diet without nausea and vomiting and having regular bowel movements.  He denies fevers and chills.  Denies issues at his incision sites.  Review of Systems:  All other review of systems: otherwise negative   Vital Signs:  BP (!) 150/92 (BP Location: Right Arm, Patient Position: Sitting, Cuff Size: Large) Comment: BP elevated. Advised to F/U with PCP. Comment (Cuff Size): manual cuff  Pulse 71   Temp 97.6 F (36.4 C) (Oral)   Resp 18   Ht 5' 9 (1.753 m)   Wt 224 lb (101.6 kg)   SpO2 96%   BMI 33.08 kg/m    Physical Exam:  Physical Exam Vitals reviewed.  Constitutional:      Appearance: Normal appearance.  Abdominal:     Comments: Abdomen soft, nondistended, no percussion tenderness, minimal incisional tenderness to palpation; no rigidity, guarding, rebound tenderness; incision sites well-healed with small amount of skin glue still present  Neurological:     Mental Status: He is alert.     Laboratory studies: None  Imaging:  None  Assessment:  59 y.o. yo Male who presents for follow-up status post robotic assisted laparoscopic umbilical hernia repair with mesh on 02/01/2024.  Plan:  - Patient overall doing well since his surgery.  Tolerating a diet, moving his bowels, and pain well-controlled - Advised that his pain will slowly continue to improve.  He needs to continue to take it easy until he is 4 weeks out from surgery.  Once he is 4 weeks out from surgery, he can slowly start increasing his activities. - Short-term disability  paperwork has been completed, and he has a return date of 03/18/2024 - Advised that if he has any remaining glue at his incision sites, he can use antibiotic ointment or Vaseline prior to showering to help loosen the remaining glue - The patient was found to have elevated blood pressure when vital signs were checked in the office. The blood pressure was rechecked by the nursing staff, and it was found be persistently elevated >140/90 mmHg. I personally advised the patient to follow up closely with the PCP for hypertension control. - Follow up with me as needed  All of the above recommendations were discussed with the patient, and all of patient's questions were answered to his expressed satisfaction.  Note: Portions of this report may have been transcribed using voice recognition software. Every effort has been made to ensure accuracy; however, inadvertent computerized transcription errors may still be present.   Dorothyann Brittle, DO [facility] [address] [place], [place] [address] [phone] (office) "

## 2024-10-11 ENCOUNTER — Encounter: Payer: Self-pay | Admitting: Family Medicine
